# Patient Record
Sex: Female | Born: 1985 | Race: Black or African American | Hispanic: No | Marital: Single | Smoking: Never smoker
Health system: Southern US, Community
[De-identification: ages and names within clinical notes are randomized; demographics above are authoritative.]

## PROBLEM LIST (undated history)

## (undated) DIAGNOSIS — L509 Urticaria, unspecified: Secondary | ICD-10-CM

## (undated) DIAGNOSIS — D509 Iron deficiency anemia, unspecified: Secondary | ICD-10-CM

## (undated) DIAGNOSIS — J45909 Unspecified asthma, uncomplicated: Secondary | ICD-10-CM

## (undated) DIAGNOSIS — I10 Essential (primary) hypertension: Secondary | ICD-10-CM

## (undated) HISTORY — DX: Urticaria, unspecified: L50.9

---

## 2013-04-30 ENCOUNTER — Emergency Department (HOSPITAL_COMMUNITY)
Admission: EM | Admit: 2013-04-30 | Discharge: 2013-05-01 | Disposition: A | Discharge status: Home / Self Care | Payer: No Typology Code available for payment source | Attending: Emergency Medicine | Admitting: Emergency Medicine

## 2013-04-30 ENCOUNTER — Encounter (HOSPITAL_COMMUNITY): Payer: Self-pay | Admitting: Emergency Medicine

## 2013-04-30 DIAGNOSIS — S335XXA Sprain of ligaments of lumbar spine, initial encounter: Secondary | ICD-10-CM | POA: Insufficient documentation

## 2013-04-30 DIAGNOSIS — Z79899 Other long term (current) drug therapy: Secondary | ICD-10-CM | POA: Insufficient documentation

## 2013-04-30 DIAGNOSIS — S39012A Strain of muscle, fascia and tendon of lower back, initial encounter: Secondary | ICD-10-CM

## 2013-04-30 DIAGNOSIS — Y9389 Activity, other specified: Secondary | ICD-10-CM | POA: Insufficient documentation

## 2013-04-30 DIAGNOSIS — Z880 Allergy status to penicillin: Secondary | ICD-10-CM | POA: Insufficient documentation

## 2013-04-30 DIAGNOSIS — IMO0002 Reserved for concepts with insufficient information to code with codable children: Secondary | ICD-10-CM | POA: Insufficient documentation

## 2013-04-30 DIAGNOSIS — S139XXA Sprain of joints and ligaments of unspecified parts of neck, initial encounter: Secondary | ICD-10-CM | POA: Insufficient documentation

## 2013-04-30 DIAGNOSIS — Y92410 Unspecified street and highway as the place of occurrence of the external cause: Secondary | ICD-10-CM | POA: Insufficient documentation

## 2013-04-30 DIAGNOSIS — S161XXA Strain of muscle, fascia and tendon at neck level, initial encounter: Secondary | ICD-10-CM

## 2013-04-30 MED ORDER — HYDROCODONE-ACETAMINOPHEN 5-325 MG PO TABS: 1.0000 | ORAL_TABLET | ORAL | Status: DC | PRN

## 2013-04-30 MED ORDER — IBUPROFEN 800 MG PO TABS: 800.0000 mg | ORAL_TABLET | Freq: Three times a day (TID) | ORAL | Status: DC

## 2013-04-30 MED ORDER — METHOCARBAMOL 500 MG PO TABS
500.0000 mg | ORAL_TABLET | Freq: Once | ORAL | Status: AC
  Administered 2013-04-30: 500 mg via ORAL
  Filled 2013-04-30: qty 1

## 2013-04-30 MED ORDER — METHOCARBAMOL 500 MG PO TABS: 500.0000 mg | ORAL_TABLET | Freq: Two times a day (BID) | ORAL | Status: DC

## 2013-04-30 MED ORDER — IBUPROFEN 800 MG PO TABS
800.0000 mg | ORAL_TABLET | Freq: Once | ORAL | Status: AC
  Administered 2013-04-30: 800 mg via ORAL
  Filled 2013-04-30: qty 1
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
## 2013-05-01 NOTE — ED Provider Notes (Signed)
Medical screening examination/treatment/procedure(s) were performed by non-physician practitioner and as supervising physician I was immediately available for consultation/collaboration.   Bryli Mantey L [place] Zou, MD 05/01/13 1247 

## 2016-10-29 LAB — HM PAP SMEAR

## 2017-01-14 ENCOUNTER — Encounter: Payer: Self-pay | Admitting: Adult Health

## 2017-07-15 ENCOUNTER — Other Ambulatory Visit: Payer: Self-pay

## 2017-07-15 ENCOUNTER — Encounter (HOSPITAL_COMMUNITY): Payer: Self-pay

## 2017-07-15 ENCOUNTER — Emergency Department (HOSPITAL_COMMUNITY)
Admission: EM | Admit: 2017-07-15 | Discharge: 2017-07-15 | Disposition: A | Discharge status: Home / Self Care | Payer: BLUE CROSS/BLUE SHIELD | Attending: Emergency Medicine | Admitting: Emergency Medicine

## 2017-07-15 ENCOUNTER — Emergency Department (HOSPITAL_COMMUNITY): Payer: BLUE CROSS/BLUE SHIELD

## 2017-07-15 DIAGNOSIS — J45909 Unspecified asthma, uncomplicated: Secondary | ICD-10-CM | POA: Diagnosis not present

## 2017-07-15 DIAGNOSIS — I10 Essential (primary) hypertension: Secondary | ICD-10-CM | POA: Diagnosis not present

## 2017-07-15 DIAGNOSIS — R079 Chest pain, unspecified: Secondary | ICD-10-CM | POA: Diagnosis present

## 2017-07-15 DIAGNOSIS — Z79899 Other long term (current) drug therapy: Secondary | ICD-10-CM | POA: Diagnosis not present

## 2017-07-15 HISTORY — DX: Unspecified asthma, uncomplicated: J45.909

## 2017-07-15 HISTORY — DX: Essential (primary) hypertension: I10

## 2017-07-15 LAB — BASIC METABOLIC PANEL
Anion gap: 12 (ref 5–15)
BUN: 11 mg/dL (ref 6–20)
CO2: 28 mmol/L (ref 22–32)
Calcium: 9.2 mg/dL (ref 8.9–10.3)
Chloride: 104 mmol/L (ref 101–111)
Creatinine, Ser: 0.85 mg/dL (ref 0.44–1.00)
GFR calc Af Amer: >60 mL/min (ref >60)
GFR calc non Af Amer: >60 mL/min (ref >60)
Glucose, Bld: 97 mg/dL (ref 65–99)
Potassium: 3.2 mmol/L — ABNORMAL LOW (ref 3.5–5.1)
Sodium: 144 mmol/L (ref 135–145)

## 2017-07-15 LAB — CBC
HCT: 41.1 % (ref 36.0–46.0)
Hemoglobin: 13.1 g/dL (ref 12.0–15.0)
MCH: 24.5 pg — ABNORMAL LOW (ref 26.0–34.0)
MCHC: 31.9 g/dL (ref 30.0–36.0)
MCV: 76.8 fL — ABNORMAL LOW (ref 78.0–100.0)
Platelets: 463 10*3/uL — ABNORMAL HIGH (ref 150–400)
RBC: 5.35 MIL/uL — ABNORMAL HIGH (ref 3.87–5.11)
RDW: 15.3 % (ref 11.5–15.5)
WBC: 10.2 10*3/uL (ref 4.0–10.5)

## 2017-07-15 LAB — I-STAT BETA HCG BLOOD, ED (MC, WL, AP ONLY): I-stat hCG, quantitative: <5 m[IU]/mL (ref <5)

## 2017-07-15 LAB — D-DIMER, QUANTITATIVE: D-Dimer, Quant: 0.29 ug/mL-FEU (ref 0.00–0.50)

## 2017-07-15 LAB — I-STAT TROPONIN, ED: Troponin i, poc: 0.01 ng/mL (ref 0.00–0.08)

## 2017-07-15 MED ORDER — NAPROXEN 375 MG PO TABS: 375.0000 mg | ORAL_TABLET | Freq: Two times a day (BID) | ORAL | 0 refills | Status: DC

## 2017-07-15 NOTE — ED Provider Notes (Signed)
[facility] EMERGENCY DEPARTMENT Provider Note   CSN: [id number] Arrival date & time: 07/15/17  2202     History   Chief Complaint Chief Complaint  Patient presents with  . Chest Pain    HPI Diana Pacasamara Umland is a 32 y.o. female.  HPI Patient presents to the emergency room for evaluation of chest pain that started a few days ago.  Patient states she had some sharp pain between her shoulder blades.  She also has been feeling short of breath.  She has a history of asthma so she tried using her inhaler without relief.  Patient's had some intermittent palpitations as well as lightheadedness.  She denies any history of heart disease.  She does have a history of hypertension.  She denies any history of PE or DVT.  No recent trips or travel.  She does take birth control pills. Past Medical History:  Diagnosis Date  . Asthma   . Hypertension     There are no active problems to display for this patient.   History reviewed. No pertinent surgical history.   OB History   None      Home Medications    Prior to Admission medications   Medication Sig Start Date End Date Taking? Authorizing Provider  albuterol (PROVENTIL HFA;VENTOLIN HFA) 108 (90 BASE) MCG/ACT inhaler Inhale 2 puffs into the lungs every 6 (six) hours as needed for wheezing or shortness of breath.   Yes [provider]  amLODipine (NORVASC) 10 MG tablet Take 10 mg by mouth daily.   Yes [provider]  cetirizine (ZYRTEC) 10 MG tablet Take 10 mg by mouth daily.   Yes [provider]  LO LOESTRIN FE 1 MG-10 MCG / 10 MCG tablet Take 1 tablet by mouth daily. 07/08/17  Yes [provider]  metoprolol tartrate (LOPRESSOR) 25 MG tablet Take 25 mg by mouth 3 (three) times daily.   Yes [provider]  Multiple Vitamin (MULTIVITAMIN WITH MINERALS) TABS tablet Take 1 tablet by mouth daily.   Yes [provider]  naproxen (NAPROSYN) 375 MG tablet Take 1 tablet (375 mg total) by  mouth 2 (two) times daily. 07/15/17   Linwood DibblesKnapp, Kallon Caylor, MD    Family History History reviewed. No pertinent family history.  Social History Social History   Tobacco Use  . Smoking status: Never Smoker  . Smokeless tobacco: Never Used  Substance Use Topics  . Alcohol use: No  . Drug use: No     Allergies   Pseudoephedrine and Amoxicillin   Review of Systems Review of Systems  All other systems reviewed and are negative.    Physical Exam Updated Vital Signs BP (!) 157/110 (BP Location: Right Arm)   Pulse 89   Temp 98.4 F (36.9 C) (Oral)   Resp (!) 22   Ht 1.575 m (5\' 2" )   Wt 65.3 kg (144 lb)   LMP 07/09/2017 (Exact Date)   SpO2 100%   BMI 26.34 kg/m   Physical Exam  Constitutional: She appears well-developed and well-nourished. No distress.  HENT:  Head: Normocephalic and atraumatic.  Right Ear: External ear normal.  Left Ear: External ear normal.  Eyes: Conjunctivae are normal. Right eye exhibits no discharge. Left eye exhibits no discharge. No scleral icterus.  Neck: Neck supple. No tracheal deviation present.  Cardiovascular: Normal rate, regular rhythm and intact distal pulses.  Pulmonary/Chest: Effort normal and breath sounds normal. No stridor. No respiratory distress. She has no wheezes. She has no rales.  Abdominal: Soft. Bowel sounds are normal. She exhibits no distension. There is no tenderness. There is no rebound and no guarding.  Musculoskeletal: She exhibits no edema or tenderness.  Neurological: She is alert. She has normal strength. No cranial nerve deficit (no facial droop, extraocular movements intact, no slurred speech) or sensory deficit. She exhibits normal muscle tone. She displays no seizure activity. Coordination normal.  Skin: Skin is warm and dry. No rash noted.  Psychiatric: She has a normal mood and affect.  Nursing note and vitals reviewed.    ED Treatments / Results  Labs (all labs ordered are listed, but only abnormal results are  displayed) Labs Reviewed  BASIC METABOLIC PANEL - Abnormal; Notable for the following components:      Result Value   Potassium 3.2 (*)    All other components within normal limits  CBC - Abnormal; Notable for the following components:   RBC 5.35 (*)    MCV 76.8 (*)    MCH 24.5 (*)    Platelets 463 (*)    All other components within normal limits  D-DIMER, QUANTITATIVE (NOT AT [facility])  I-STAT TROPONIN, ED  I-STAT BETA HCG BLOOD, ED ([facility], [facility], [facility] ONLY)    EKG EKG Interpretation  Date/Time:  Wednesday July 15 2017 22:18:37 EDT Ventricular Rate:  83 PR Interval:    QRS Duration: 88 QT Interval:  362 QTC Calculation: 426 R Axis:   58 Text Interpretation:  Sinus rhythm Probable left atrial enlargement No old tracing to compare Confirmed by Linwood Dibbles [phone]) on 07/15/2017 10:27:24 PM   Radiology Dg Chest 2 View  Result Date: 07/15/2017 CLINICAL DATA:  Chest pain EXAM: CHEST - 2 VIEW COMPARISON:  None. FINDINGS: The cardiac silhouette is borderline to mildly enlarged. The hila and mediastinum are normal. No pneumothorax. No pulmonary nodules or masses. No other acute abnormalities. IMPRESSION: The cardiac silhouette is borderline to mildly enlarged. Electronically Signed   By: Gerome Sam III M.D   On: 07/15/2017 23:04    Procedures Procedures (including critical care time)  Medications Ordered in ED Medications - No data to display   Initial Impression / Assessment and Plan / ED Course  I have reviewed the triage vital signs and the nursing notes.  Pertinent labs & imaging results that were available during my care of the patient were reviewed by me and considered in my medical decision making (see chart for details).   Sx atypical for cardiac etiology.  EKG and labs are reassuring.  Doubt ACS.  Low risk for PE,  D dimer negative.  Doubt PE.  Dc home with nsaids.  Outpatient follow up if sx persist.  Final Clinical Impressions(s) / ED Diagnoses   Final diagnoses:    Chest pain, unspecified type    ED Discharge Orders        Ordered    naproxen (NAPROSYN) 375 MG tablet  2 times daily     07/15/17 2327       Linwood Dibbles, MD 07/15/17 2329

## 2017-07-15 NOTE — ED Triage Notes (Signed)
Having chest pain for the past couple of days, along with pain between my shoulder blades.  I have a history of asthma and have been using my inhalers without relief.  I have checked my blood pressure and it has been okay at home.

## 2017-07-15 NOTE — Discharge Instructions (Addendum)
Take the medications as needed for pain, follow up with your primary care doctor if not better in a week

## 2018-07-16 ENCOUNTER — Encounter: Payer: Self-pay | Admitting: Family Medicine

## 2018-07-16 ENCOUNTER — Ambulatory Visit (INDEPENDENT_AMBULATORY_CARE_PROVIDER_SITE_OTHER): Payer: 59 | Admitting: Family Medicine

## 2018-07-16 ENCOUNTER — Other Ambulatory Visit: Payer: Self-pay

## 2018-07-16 VITALS — BP 144/100 | HR 100 | Temp 98.2°F | Resp 14 | Ht 62.0 in | Wt 148.0 lb

## 2018-07-16 DIAGNOSIS — Z0001 Encounter for general adult medical examination with abnormal findings: Secondary | ICD-10-CM | POA: Diagnosis not present

## 2018-07-16 DIAGNOSIS — F5101 Primary insomnia: Secondary | ICD-10-CM | POA: Diagnosis not present

## 2018-07-16 DIAGNOSIS — Z114 Encounter for screening for human immunodeficiency virus [HIV]: Secondary | ICD-10-CM

## 2018-07-16 DIAGNOSIS — F4323 Adjustment disorder with mixed anxiety and depressed mood: Secondary | ICD-10-CM

## 2018-07-16 DIAGNOSIS — I10 Essential (primary) hypertension: Secondary | ICD-10-CM

## 2018-07-16 DIAGNOSIS — Z0000 Encounter for general adult medical examination without abnormal findings: Secondary | ICD-10-CM

## 2018-07-16 MED ORDER — AMLODIPINE BESYLATE 10 MG PO TABS: 10.0000 mg | ORAL_TABLET | Freq: Every day | ORAL | 3 refills | Status: DC

## 2018-07-16 MED ORDER — TRAZODONE HCL 50 MG PO TABS
25.0000 mg | ORAL_TABLET | Freq: Every evening | ORAL | 3 refills | Status: DC | PRN
Start: 2018-07-16 — End: 2020-06-22

## 2018-07-16 MED ORDER — PRENATAL VITAMINS 28-0.8 MG PO TABS: ORAL_TABLET | ORAL | Status: AC

## 2018-07-16 MED ORDER — ALBUTEROL SULFATE HFA 108 (90 BASE) MCG/ACT IN AERS: 2.0000 | INHALATION_SPRAY | RESPIRATORY_TRACT | 2 refills | Status: DC | PRN

## 2018-07-16 MED ORDER — HAIR VITAMINS PO TABS: ORAL_TABLET | ORAL | 0 refills | Status: DC

## 2018-07-16 MED ORDER — CETIRIZINE HCL 10 MG PO TABS: 10.0000 mg | ORAL_TABLET | Freq: Every day | ORAL | Status: DC

## 2018-07-16 MED ORDER — FLUTICASONE PROPIONATE 50 MCG/ACT NA SUSP: 2.0000 | Freq: Every day | NASAL | 6 refills | Status: DC

## 2018-07-16 NOTE — Progress Notes (Signed)
Subjective:    Patient ID: Diana [name], female    DOB: 09-08-1985, 33 y.o.   MRN: [id number]  Patient presents for New Patient (Initial Visit) (Elevated BP)  Patient here to establish care.  Previous PCP- Dr. Rondel OhVassireddy  [facility]  - PAP Smear UTD    No breast cancer in family    Menses regular   PMH/MEDS/FAMILY history reviewed  HTN- History of  HTN diagnosed age  33, also runs un family.  she changed jobs and lost insurance, has not been on meds  > 6 months, was on norvasc10mg  and metoprolol 25mg  daily   Elevated  170/101 when she last checked No known kidney disease      Contraceptive management - none current, no children, follows with GYN  Asthma- has albuterol prn, in the past was on advair. Last weeks had asthma attack and no meds.Takes Flonase, zyrtec   Works as Print production planneroffice manager for [employer]   Has been workin g on weight loss , was down to 138lbs   TDAP- Due  Influenza UTD   She has been under a lot of stress for the past month of so, difficulty at work, feels panicy at times other times just sad and depressed. Not sleeping well, takes melatonin every night Doesn't have anyone to talk to, states her family has there own lives and she is often the one they come to and she doesn't want to bother them Her new job has been particularly stressful recently Would like something to help her stress/sleep    Review Of Systems:  GEN- denies fatigue, fever, weight loss,weakness, recent illness HEENT- denies eye drainage, change in vision, nasal discharge, CVS- denies chest pain, palpitations RESP- denies SOB, cough, wheeze ABD- denies N/V, change in stools, abd pain GU- denies dysuria, hematuria, dribbling, incontinence MSK- denies joint pain, muscle aches, injury Neuro- denies headache, dizziness, syncope, seizure activity       Objective:    BP (!) 144/100   Pulse 100   Temp 98.2 F (36.8 C) (Oral)   Resp 14   Ht 5\' 2"  (1.575  m)   Wt 148 lb (67.1 kg)   SpO2 99%   BMI 27.07 kg/m  GEN- NAD, alert and oriented x3 HEENT- PERRL, EOMI, non injected sclera, pink conjunctiva, MMM, oropharynx clear Neck- Supple, no thyromegaly CVS- RRR, no murmur RESP-CTAB ABD-NABS,soft,NT,ND EXT- No edema Psych- pleasant, normal affect and mood, no apparent SI, well groomed  Pulses- Radial, DP- 2+        Assessment & Plan:      Problem List Items Addressed This Visit      Unprioritized   Essential hypertension    Restart norvasc 10mg  Check fasting labs Will add BB at next visit based on response  Continue with exercise routine, dietary changes       Relevant Medications   amLODipine (NORVASC) 10 MG tablet   Other Relevant Orders   TSH (Completed)   CBC with Differential/Platelet (Completed)   Comprehensive metabolic panel (Completed)   Lipid panel (Completed)   Insomnia    For stress and sleep, will try trazadone Start with  25mg  at bedtime and titrate to  50mg  if not improved Discussed therapy with her as well, she feels she has no one to talk to , let off steam      Situational mixed anxiety and depressive disorder   Relevant Medications   traZODone (DESYREL) 50 MG tablet    Other Visit Diagnoses  Routine general medical examination at a health care facility    -  Primary   CPE done obtain fasting labs, obtain GYn records, and previous PCP records , HIV screen   Relevant Orders   TSH (Completed)   CBC with Differential/Platelet (Completed)   Comprehensive metabolic panel (Completed)   Lipid panel (Completed)   Encounter for screening for HIV       Relevant Orders   HIV Antibody (routine testing w rflx)      Note: This dictation was prepared with Dragon dictation along with smaller phrase technology. Any transcriptional errors that result from this process are unintentional.

## 2018-07-16 NOTE — Patient Instructions (Addendum)
F/u 4 WEEKS  We will call with lab results  Take trazodone 1 hour before bedtime start with 1/2 tablet, if that does not work then take full tablet Start norvasc 10mg  once a day for blood pressure

## 2018-07-17 ENCOUNTER — Encounter: Payer: Self-pay | Admitting: Family Medicine

## 2018-07-17 DIAGNOSIS — I10 Essential (primary) hypertension: Secondary | ICD-10-CM | POA: Insufficient documentation

## 2018-07-17 DIAGNOSIS — G4700 Insomnia, unspecified: Secondary | ICD-10-CM | POA: Insufficient documentation

## 2018-07-17 DIAGNOSIS — F4323 Adjustment disorder with mixed anxiety and depressed mood: Secondary | ICD-10-CM | POA: Insufficient documentation

## 2018-07-17 NOTE — Assessment & Plan Note (Addendum)
Restart norvasc 10mg  Check fasting labs Will add BB at next visit based on response  Continue with exercise routine, dietary changes

## 2018-07-17 NOTE — Assessment & Plan Note (Signed)
For stress and sleep, will try trazadone Start with  25mg  at bedtime and titrate to  50mg  if not improved Discussed therapy with her as well, she feels she has no one to talk to , let off steam

## 2018-07-19 NOTE — Addendum Note (Signed)
Addended by: Vic Blackbird F on: 07/19/2018 03:28 PM   Modules accepted: Orders

## 2018-07-20 LAB — COMPREHENSIVE METABOLIC PANEL
AG Ratio: 1.6 (calc) (ref 1.0–2.5)
ALT: 13 U/L (ref 6–29)
AST: 14 U/L (ref 10–30)
Albumin: 4.3 g/dL (ref 3.6–5.1)
Alkaline phosphatase (APISO): 115 U/L (ref 31–125)
BUN: 15 mg/dL (ref 7–25)
CO2: 28 mmol/L (ref 20–32)
Calcium: 9.4 mg/dL (ref 8.6–10.2)
Chloride: 99 mmol/L (ref 98–110)
Creat: 0.65 mg/dL (ref 0.50–1.10)
Globulin: 2.7 g/dL (calc) (ref 1.9–3.7)
Glucose, Bld: 86 mg/dL (ref 65–99)
Potassium: 3.6 mmol/L (ref 3.5–5.3)
Sodium: 138 mmol/L (ref 135–146)
Total Bilirubin: 0.5 mg/dL (ref 0.2–1.2)
Total Protein: 7 g/dL (ref 6.1–8.1)

## 2018-07-20 LAB — LIPID PANEL
Cholesterol: 168 mg/dL (ref <200)
HDL: 66 mg/dL (ref >=50)
LDL Cholesterol (Calc): 89 mg/dL (calc)
Non-HDL Cholesterol (Calc): 102 mg/dL (calc) (ref <130)
Total CHOL/HDL Ratio: 2.5 (calc) (ref <5.0)
Triglycerides: 44 mg/dL (ref <150)

## 2018-07-20 LAB — CBC WITH DIFFERENTIAL/PLATELET
Absolute Monocytes: 690 cells/uL (ref 200–950)
Basophils Absolute: 64 cells/uL (ref 0–200)
Basophils Relative: 0.7 %
Eosinophils Absolute: 138 cells/uL (ref 15–500)
Eosinophils Relative: 1.5 %
HCT: 38.8 % (ref 35.0–45.0)
Hemoglobin: 12.7 g/dL (ref 11.7–15.5)
Lymphs Abs: 3376 cells/uL (ref 850–3900)
MCH: 25.1 pg — ABNORMAL LOW (ref 27.0–33.0)
MCHC: 32.7 g/dL (ref 32.0–36.0)
MCV: 76.7 fL — ABNORMAL LOW (ref 80.0–100.0)
MPV: 9.2 fL (ref 7.5–12.5)
Monocytes Relative: 7.5 %
Neutro Abs: 4931 cells/uL (ref 1500–7800)
Neutrophils Relative %: 53.6 %
Platelets: 464 10*3/uL — ABNORMAL HIGH (ref 140–400)
RBC: 5.06 10*6/uL (ref 3.80–5.10)
RDW: 14.2 % (ref 11.0–15.0)
Total Lymphocyte: 36.7 %
WBC: 9.2 10*3/uL (ref 3.8–10.8)

## 2018-07-20 LAB — TEST AUTHORIZATION

## 2018-07-20 LAB — TSH: TSH: 0.31 mIU/L — ABNORMAL LOW

## 2018-07-20 LAB — T4, FREE: Free T4: 1.3 ng/dL (ref 0.8–1.8)

## 2018-07-20 LAB — T3, FREE: T3, Free: 4.2 pg/mL (ref 2.3–4.2)

## 2018-07-20 LAB — HIV ANTIBODY (ROUTINE TESTING W REFLEX): HIV 1&2 Ab, 4th Generation: NONREACTIVE

## 2018-07-28 ENCOUNTER — Encounter: Payer: Self-pay | Admitting: Family Medicine

## 2018-08-03 ENCOUNTER — Encounter: Payer: Self-pay | Admitting: Family Medicine

## 2018-08-03 MED ORDER — METOPROLOL SUCCINATE ER 25 MG PO TB24: 25.0000 mg | ORAL_TABLET | Freq: Every day | ORAL | 3 refills | Status: DC

## 2018-08-09 ENCOUNTER — Encounter: Payer: Self-pay | Admitting: *Deleted

## 2018-08-16 ENCOUNTER — Encounter: Payer: Self-pay | Admitting: Family Medicine

## 2018-08-16 ENCOUNTER — Ambulatory Visit (INDEPENDENT_AMBULATORY_CARE_PROVIDER_SITE_OTHER): Payer: 59 | Admitting: Family Medicine

## 2018-08-16 ENCOUNTER — Other Ambulatory Visit: Payer: Self-pay

## 2018-08-16 VITALS — BP 136/84 | HR 90 | Temp 98.6°F | Resp 12 | Ht 62.0 in | Wt 153.0 lb

## 2018-08-16 DIAGNOSIS — I10 Essential (primary) hypertension: Secondary | ICD-10-CM

## 2018-08-16 DIAGNOSIS — J301 Allergic rhinitis due to pollen: Secondary | ICD-10-CM

## 2018-08-16 DIAGNOSIS — F4323 Adjustment disorder with mixed anxiety and depressed mood: Secondary | ICD-10-CM | POA: Diagnosis not present

## 2018-08-16 DIAGNOSIS — R7989 Other specified abnormal findings of blood chemistry: Secondary | ICD-10-CM

## 2018-08-16 DIAGNOSIS — F5101 Primary insomnia: Secondary | ICD-10-CM

## 2018-08-16 DIAGNOSIS — J309 Allergic rhinitis, unspecified: Secondary | ICD-10-CM | POA: Insufficient documentation

## 2018-08-16 NOTE — Assessment & Plan Note (Signed)
Increase Flonase to twice a day dosing to help with the rhinitis and postnasal drip.

## 2018-08-16 NOTE — Assessment & Plan Note (Signed)
She is much improved today.  She is tolerating the metoprolol no change to medication.  For her abnormal thyroid function study [place] recheck that today.

## 2018-08-16 NOTE — Patient Instructions (Signed)
F/u 4 MONTHS  

## 2018-08-16 NOTE — Assessment & Plan Note (Signed)
Her sleep has improved with the trazodone which helps her stress some.  She is going to follow-up with therapist as scheduled in a few weeks.

## 2018-08-16 NOTE — Progress Notes (Signed)
   Subjective:    Patient ID: Diana [name], female    DOB: 09/13/1985, 33 y.o.   MRN: [id number]  Patient presents for Follow-up (is not fasting) HTN- had itching with norvasc, so started on Toprol 25mg  once a day, home readings 130's/80's    Pt has appt in a few weeks, tradozone 25mg  at bedtime, which helps with sleep she does continue to have significant amount of stress which she has scheduled with therapist her appointment is in a few weeks.  Asthma- has only used twice since our last visisit, she does zyrtec and flonase at bedtime, still gets scratchy throat when outside   Had ENT f/u , no stones in the gland.    No history of thyroid disorder- but in the past was worked up due to hypertension her recent labs did show abnormal TSH with normal T3 and T4 she is due for repeat        Review Of Systems:  GEN- denies fatigue, fever, weight loss,weakness, recent illness HEENT- denies eye drainage, change in vision, nasal discharge, CVS- denies chest pain, palpitations RESP- denies SOB, cough, wheeze ABD- denies N/V, change in stools, abd pain GU- denies dysuria, hematuria, dribbling, incontinence MSK- denies joint pain, muscle aches, injury Neuro- denies headache, dizziness, syncope, seizure activity       Objective:    BP 136/84   Pulse 90   Temp 98.6 F (37 C) (Oral)   Resp 12   Ht 5\' 2"  (1.575 m)   Wt 153 lb (69.4 kg)   LMP 08/04/2018 Comment: regular  SpO2 99%   BMI 27.98 kg/m  GEN- NAD, alert and oriented x3 HEENT- PERRL, EOMI, non injected sclera, pink conjunctiva, MMM, oropharynx clear Neck- Supple, no thyromegaly, no LAD CVS- RRR, no murmur RESP-CTAB Psych- normal affect and  mood EXT- No edema Pulses- Radial 2+        Assessment & Plan:      Problem List Items Addressed This Visit      Unprioritized   Allergic rhinitis    Increase Flonase to twice a day dosing to help with the rhinitis and postnasal drip.      Essential hypertension -  Primary    She is much improved today.  She is tolerating the metoprolol no change to medication.  For her abnormal thyroid function study [place] recheck that today.      Insomnia   Situational mixed anxiety and depressive disorder    Her sleep has improved with the trazodone which helps her stress some.  She is going to follow-up with therapist as scheduled in a few weeks.       Other Visit Diagnoses    Abnormal TSH       Relevant Orders   TSH   T3, free   T4, free      Note: This dictation was prepared with Dragon dictation along with smaller phrase technology. Any transcriptional errors that result from this process are unintentional.

## 2018-08-17 LAB — T4, FREE: Free T4: 1.1 ng/dL (ref 0.8–1.8)

## 2018-08-17 LAB — TSH: TSH: 0.28 mIU/L — ABNORMAL LOW

## 2018-08-17 LAB — T3, FREE: T3, Free: 3.3 pg/mL (ref 2.3–4.2)

## 2018-08-17 LAB — EXTRA LAV TOP TUBE

## 2018-08-25 ENCOUNTER — Other Ambulatory Visit: Payer: Self-pay | Admitting: *Deleted

## 2018-08-25 DIAGNOSIS — E0590 Thyrotoxicosis, unspecified without thyrotoxic crisis or storm: Secondary | ICD-10-CM

## 2018-09-16 ENCOUNTER — Encounter: Payer: Self-pay | Admitting: Family Medicine

## 2018-09-16 NOTE — Telephone Encounter (Signed)
Call placed to patient to inquire.   Reports that she has x2 days of upper back pain in between her shoulder blades that worsens when she take a deep breath. Reports that pain is intermittent, but is noticeable with moving either arm up or down or reaching over her head.   States that she had similar pain in January and was evaluated in UC with no cause found. Muscle relaxer was given and is helpful. Also states that heating pad is helpful.   Advised to use muscle relaxer and heating pad as needed. Advised to rest as much as possible to give muscle time to heal. Advised if pain is not improved by Monday to schedule OV for evaluation.

## 2018-10-12 ENCOUNTER — Encounter: Payer: Self-pay | Admitting: Family Medicine

## 2018-10-14 ENCOUNTER — Ambulatory Visit: Payer: 59 | Admitting: "Endocrinology

## 2018-11-12 ENCOUNTER — Ambulatory Visit: Payer: 59 | Admitting: "Endocrinology

## 2018-11-30 ENCOUNTER — Encounter: Payer: Self-pay | Admitting: Family Medicine

## 2018-12-24 ENCOUNTER — Ambulatory Visit: Payer: 59 | Admitting: Family Medicine

## 2019-02-11 ENCOUNTER — Other Ambulatory Visit: Payer: Self-pay

## 2019-02-11 ENCOUNTER — Ambulatory Visit (INDEPENDENT_AMBULATORY_CARE_PROVIDER_SITE_OTHER): Payer: Self-pay | Admitting: Family Medicine

## 2019-02-11 ENCOUNTER — Encounter: Payer: Self-pay | Admitting: *Deleted

## 2019-02-11 ENCOUNTER — Encounter: Payer: Self-pay | Admitting: Family Medicine

## 2019-02-11 VITALS — BP 152/98 | HR 88 | Temp 98.1°F | Resp 14 | Ht 62.0 in | Wt 148.0 lb

## 2019-02-11 DIAGNOSIS — M546 Pain in thoracic spine: Secondary | ICD-10-CM

## 2019-02-11 DIAGNOSIS — E041 Nontoxic single thyroid nodule: Secondary | ICD-10-CM

## 2019-02-11 DIAGNOSIS — I10 Essential (primary) hypertension: Secondary | ICD-10-CM

## 2019-02-11 DIAGNOSIS — E0590 Thyrotoxicosis, unspecified without thyrotoxic crisis or storm: Secondary | ICD-10-CM | POA: Insufficient documentation

## 2019-02-11 DIAGNOSIS — G8929 Other chronic pain: Secondary | ICD-10-CM

## 2019-02-11 DIAGNOSIS — F5101 Primary insomnia: Secondary | ICD-10-CM

## 2019-02-11 MED ORDER — CYCLOBENZAPRINE HCL 5 MG PO TABS: 5.0000 mg | ORAL_TABLET | Freq: Three times a day (TID) | ORAL | 1 refills | Status: DC | PRN

## 2019-02-11 NOTE — Assessment & Plan Note (Signed)
Repeat blood pressure was elevated in the office but initially quite calm and it was down.  We will have her check her blood pressure at home regularly.  If her blood pressure is greater than 140/90 then she is to increase her metoprolol to 75 mg twice a day.  And obtain all the records from the hospital she did not know results of her blood work or most of the imaging.  Her EKG is reassuring today with regard to the symptoms into her left hand.  I did query may be a carpal tunnel as she is on the computer a lot however this recently started after her blood pressure went up.  My other concern is underlying subclinical hyper thyroidism it may be that she is actually converted to a true hyperthyroidism.  She is willing to establish with an endocrinologist as she also has this calcified nodule which was told it needs to be followed up.Marland Kitchen

## 2019-02-11 NOTE — Progress Notes (Signed)
Subjective:    Patient ID: Diana [name], female    DOB: January 04, 1986, 34 y.o.   MRN: [id number]  Patient presents for ER F/U (HTN- [place]) and L Sided Pain (hand, fingers going numb, pain in arm)  Patient here for ER follow-up.  She was seen at the [facility] after an episode of elevated blood pressure. She was at work She was also having numbness and tingling into her left hand. She was on metoprolol 25 mg once a day for blood pressure as well as trazodone as needed for sleep but she had not been taking the trazodone.  Allergy medications as needed Hx of blood pressure when she was at work having the symptoms it was elevated so she was sent to the local emergency room for evaluation.  When she was finally seen in the emergency room after about a 12-hour wait blood pressure was very elevated 210/90 , she had CT of head and chest  she told that she had a calcified nodule on her thyroid not sure if it was enlarged or not.   Told CT head of was normal She had EKG done/ CXR/ spine xray was not told anything   Also has known clinical hypothyroidism her last set of thyroid labs were done in July.  Her TSH at that time was 0.28 she was referred to endocrinology but she was never able to make the appointment.  Fortunately she has lost her insurance and she has changed jobs  She is now on metporlol 50mg  BID states that they change her medication after they gave her something for her IV to bring her blood pressure down. Last night she felt a little discomfort into her left arm, and pink finger , had tingling and numbness and arm felt a little cold on outer side  She did not have any of these hand/finger symptoms before her BP went up  She get gets intermittant back pain between the shoulder blades for hte past year Feels like she cant breathe Seen by chiropracter told it was a strain and they did manipulations She uses heating pad, TENS machine which  helps a little  She tries not to take a lot of medication for his    Review Of Systems:  GEN- denies fatigue, fever, weight loss,weakness, recent illness HEENT- denies eye drainage, change in vision, nasal discharge, CVS- denies chest pain, palpitations RESP- denies SOB, cough, wheeze ABD- denies N/V, change in stools, abd pain GU- denies dysuria, hematuria, dribbling, incontinence MSK- denies joint pain, +muscle aches, injury Neuro- denies headache, dizziness, syncope, seizure activity       Objective:    BP (!) 152/98   Pulse 88   Temp 98.1 F (36.7 C) (Temporal)   Resp 14   Ht 5\' 2"  (1.575 m)   Wt 148 lb (67.1 kg)   LMP 02/08/2019 Comment: regular  SpO2 94%   BMI 27.07 kg/m  GEN- NAD, alert and oriented x3 HEENT- PERRL, EOMI, non injected sclera, pink conjunctiva, MMM, oropharynx clear Neck- Supple, no thyromegaly CVS- RRR, no murmur RESP-CTAB ABD-NABS,soft,NT,ND MSK-spine nontender no spasm noted good range of motion, rotator cuff intact full range of motion upper extremity EXT- No edema Pulses- Radial, DP- 2+   KG normal sinus rhythm no ST changes     Assessment & Plan:      Problem List Items Addressed This Visit      Unprioritized   Essential hypertension - Primary    Repeat  blood pressure was elevated in the office but initially quite calm and it was down.  We will have her check her blood pressure at home regularly.  If her blood pressure is greater than 140/90 then she is to increase her metoprolol to 75 mg twice a day.  And obtain all the records from the hospital she did not know results of her blood work or most of the imaging.  Her EKG is reassuring today with regard to the symptoms into her left hand.  I did query may be a carpal tunnel as she is on the computer a lot however this recently started after her blood pressure went up.  My other concern is underlying subclinical hyper thyroidism it may be that she is actually converted to a true  hyperthyroidism.  She is willing to establish with an endocrinologist as she also has this calcified nodule which was told it needs to be followed up..      Relevant Orders   EKG 12-Lead (Completed)   Subclinical hyperthyroidism    Other Visit Diagnoses    Thyroid nodule       Chronic midline thoracic back pain       Musculoskeletal pain.  She has had imaging on the spine we will obtain this as well.  In the meantime have given her Flexeril to use for spasm,she can use NSAID   Relevant Medications   cyclobenzaprine (FLEXERIL) 5 MG tablet      Note: This dictation was prepared with Dragon dictation along with smaller phrase technology. Any transcriptional errors that result from this process are unintentional.

## 2019-02-11 NOTE — Patient Instructions (Addendum)
Increase metoprolol to  75mg  BID  If BP is > 140/90 at home  We will call once I review all the notes Flexeril for muscle spasms Referral to endocrinology F/U pending results

## 2019-02-11 NOTE — Assessment & Plan Note (Signed)
Restart  Trazodone, which helped before

## 2019-02-14 ENCOUNTER — Telehealth: Payer: Self-pay | Admitting: Family Medicine

## 2019-02-14 NOTE — Telephone Encounter (Signed)
Call patient.  Her chest x-ray did not show any pneumonia but changes consistent with small airway disease or asthma.  She not have any difficulty breathing nothing needs to be done she does have albuterol on hand.  CT scan of her head did not show any abnormalities.  CT scan of her chest did not show any blood clot in the lungs her heart was mildly enlarged this can be seen with hypertension.  Continue the medications like we discussed.  She also has a calcified right thyroid nodule which she has been referred to endocrinology for.  I reviewed the labs.  They did not draw thyroid function studies.  These will need to be done with the endocrinologist.

## 2019-02-14 NOTE — Telephone Encounter (Signed)
Call placed to patient. LMTRC.  

## 2019-02-15 NOTE — Telephone Encounter (Signed)
Call placed to patient. LMTRC.  

## 2019-02-17 NOTE — Telephone Encounter (Signed)
Pt notified Verbalizes understanding 

## 2019-03-25 ENCOUNTER — Encounter: Payer: Self-pay | Admitting: Family Medicine

## 2019-03-25 MED ORDER — METOPROLOL TARTRATE 50 MG PO TABS: 50.0000 mg | ORAL_TABLET | Freq: Two times a day (BID) | ORAL | 2 refills | Status: DC

## 2019-03-30 ENCOUNTER — Other Ambulatory Visit: Payer: Self-pay

## 2019-03-30 ENCOUNTER — Encounter: Payer: Self-pay | Admitting: Family Medicine

## 2019-03-30 ENCOUNTER — Encounter: Payer: Self-pay | Admitting: "Endocrinology

## 2019-03-30 ENCOUNTER — Ambulatory Visit (INDEPENDENT_AMBULATORY_CARE_PROVIDER_SITE_OTHER): Payer: Self-pay | Admitting: "Endocrinology

## 2019-03-30 VITALS — BP 140/82 | HR 75 | Ht 62.0 in | Wt 149.6 lb

## 2019-03-30 DIAGNOSIS — E0520 Thyrotoxicosis with toxic multinodular goiter without thyrotoxic crisis or storm: Secondary | ICD-10-CM

## 2019-03-30 MED ORDER — PREDNISONE 10 MG PO TABS: ORAL_TABLET | ORAL | 0 refills | Status: DC

## 2019-03-30 MED ORDER — CETIRIZINE HCL 10 MG PO TABS: 10.0000 mg | ORAL_TABLET | Freq: Every day | ORAL | 1 refills | Status: DC

## 2019-03-30 NOTE — Telephone Encounter (Signed)
Call placed to patient and patient made aware.   Prescription sent to pharmacy.  

## 2019-03-30 NOTE — Progress Notes (Signed)
Endocrinology Consult Note                                            03/30/2019, 3:33 PM   Subjective:    Patient ID: Diana [name], female    DOB: 1985/07/19, PCP Alycia Rossetti, MD   Past Medical History:  Diagnosis Date  . Asthma   . Hypertension    History reviewed. No pertinent surgical history. Social History   Socioeconomic History  . Marital status: Single    Spouse name: Not on file  . Number of children: Not on file  . Years of education: Not on file  . Highest education level: Not on file  Occupational History  . Not on file  Tobacco Use  . Smoking status: Never Smoker  . Smokeless tobacco: Never Used  Substance and Sexual Activity  . Alcohol use: No  . Drug use: No  . Sexual activity: Not on file  Other Topics Concern  . Not on file  Social History Narrative  . Not on file   Social Determinants of Health   Financial Resource Strain:   . Difficulty of Paying Living Expenses: Not on file  Food Insecurity:   . Worried About Charity fundraiser in the Last Year: Not on file  . Ran Out of Food in the Last Year: Not on file  Transportation Needs:   . Lack of Transportation (Medical): Not on file  . Lack of Transportation (Non-Medical): Not on file  Physical Activity:   . Days of Exercise per Week: Not on file  . Minutes of Exercise per Session: Not on file  Stress:   . Feeling of Stress : Not on file  Social Connections:   . Frequency of Communication with Friends and Family: Not on file  . Frequency of Social Gatherings with Friends and Family: Not on file  . Attends Religious Services: Not on file  . Active Member of Clubs or Organizations: Not on file  . Attends Archivist Meetings: Not on file  . Marital Status: Not on file   Family History  Problem Relation Age of Onset  . Hypertension Mother   . Hypertension Father    Outpatient Encounter Medications as of 03/30/2019  Medication Sig  . albuterol (VENTOLIN HFA) 108  (90 Base) MCG/ACT inhaler Inhale 2 puffs into the lungs every 4 (four) hours as needed for wheezing or shortness of breath.  . cetirizine (ZYRTEC) 10 MG tablet Take 1 tablet (10 mg total) by mouth daily.  . cyclobenzaprine (FLEXERIL) 5 MG tablet Take 1 tablet (5 mg total) by mouth 3 (three) times daily as needed for muscle spasms.  . fluticasone (FLONASE) 50 MCG/ACT nasal spray Place 2 sprays into both nostrils daily.  . metoprolol tartrate (LOPRESSOR) 50 MG tablet Take 1 tablet (50 mg total) by mouth 2 (two) times daily.  . Multiple Vitamins-Minerals (HAIR VITAMINS) TABS 1 tablet daily  . Prenatal Vit-Fe Fumarate-FA (PRENATAL VITAMINS) 28-0.8 MG TABS 1 tablet daily  . traZODone (DESYREL) 50 MG tablet Take 0.5-1 tablets (25-50 mg total) by mouth at bedtime as needed for sleep.   No facility-administered encounter medications on file as of 03/30/2019.   ALLERGIES: Allergies  Allergen Reactions  . Pseudoephedrine Palpitations    Raises BP also  . Norvasc [Amlodipine Besylate] Itching  . Amoxicillin Itching and  Rash    VACCINATION STATUS: Immunization History  Administered Date(s) Administered  . Hepatitis B 11/23/1995, 12/21/1995, 05/30/1996    HPI Diana [name] is 34 y.o. female who presents today with a medical history as above. she is being seen in consultation for subclinical hypothyroidism, nodular goiter requested by Salley Scarlet, MD.   She denies any prior history of thyroid dysfunction which required a prescription.  She was found to have suppressed TSH in June and July 2020.  She reports weight loss of 5 pounds since July 2020.  She denies palpitations, tremors, heat/cold intolerance.  She denies any family history of thyroid dysfunction.  She is not currently on thyroid hormone supplements nor antithyroid treatment. Chest CT angio done for complaint of chest tightness in January 2021 incidentally showed calcified right thyroid nodule measuring 1.4 x 1.8 cm.  She denies  dysphagia, shortness of breath, no voice change.  Review of Systems  Constitutional: no recent weight gain/loss, no fatigue, no subjective hyperthermia, no subjective hypothermia Eyes: no blurry vision, no xerophthalmia ENT: no sore throat, no nodules palpated in throat, no dysphagia/odynophagia, no hoarseness Cardiovascular: no Chest Pain, no Shortness of Breath, no palpitations, no leg swelling Respiratory: no cough, no shortness of breath Gastrointestinal: no Nausea/Vomiting/Diarhhea Musculoskeletal: no muscle/joint aches Skin: no rashes Neurological: no tremors, no numbness, no tingling, no dizziness Psychiatric: no depression, no anxiety  Objective:    Vitals with BMI 03/30/2019 02/11/2019 02/11/2019  Height 5\' 2"  - 5\' 2"   Weight 149 lbs 10 oz - 148 lbs  BMI 27.36 - 27.06  Systolic 140 152  Diastolic 82 98 64  Pulse 75 - 88    BP 140/82   Pulse 75   Ht 5\' 2"  (1.575 m)   Wt 149 lb 9.6 oz (67.9 kg)   BMI 27.36 kg/m   Wt Readings from Last 3 Encounters:  03/30/19 149 lb 9.6 oz (67.9 kg)  02/11/19 148 lb (67.1 kg)  08/16/18 153 lb (69.4 kg)    Physical Exam  Constitutional:  Body mass index is 27.36 kg/m.,  not in acute distress, normal state of mind Eyes: PERRLA, EOMI, no exophthalmos ENT: moist mucous membranes, + palpable thyromegaly, no gross cervical lymphadenopathy Cardiovascular: normal precordial activity, Regular Rate and Rhythm, no Murmur/Rubs/Gallops Respiratory:  adequate breathing efforts, no gross chest deformity, Clear to auscultation bilaterally Gastrointestinal: abdomen soft, Non -tender, No distension, Bowel Sounds present, no gross organomegaly Musculoskeletal: no gross deformities, strength intact in all four extremities Skin: moist, warm, no rashes Neurological: no tremor with outstretched hands, Deep tendon reflexes normal in bilateral lower extremities.  CMP ( most recent) CMP     Component Value Date/Time   NA 138 07/16/2018 1255   K 3.6  07/16/2018 1255   CL 99 07/16/2018 1255   CO2 28 07/16/2018 1255   GLUCOSE 86 07/16/2018 1255   BUN 15 07/16/2018 1255   CREATININE 0.65 07/16/2018 1255   CALCIUM 9.4 07/16/2018 1255   PROT 7.0 07/16/2018 1255   AST 14 07/16/2018 1255   ALT 13 07/16/2018 1255   BILITOT 0.5 07/16/2018 1255   GFRNONAA >60 07/15/2017 2229   GFRAA >60 07/15/2017 2229    Lipid Panel ( most recent) Lipid Panel     Component Value Date/Time   CHOL 168 07/16/2018 1255   TRIG 44 07/16/2018 1255   HDL 66 07/16/2018 1255   CHOLHDL 2.5 07/16/2018 1255   LDLCALC 89 07/16/2018 1255      Lab Results  Component Value  Date   TSH 0.28 (L) 08/16/2018   TSH 0.31 (L) 07/16/2018   FREET4 1.1 08/16/2018   FREET4 1.3 07/16/2018     Assessment & Plan:   1. 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 - I have reviewed her available thyroid records and clinically evaluated the patient. - Based on these reviews, she has subclinical hyperthyroidism, incidental finding of thyroid nodule,  however,  there is not sufficient information to proceed with definitive treatment plan.  - she will need a repeat,  more complete thyroid function tests towards confirming the diagnosis.  She will be sent to lab for TSH, free T4/free T3, and antithyroid antibodies. -she will also need thyroid uptake and scan to characterize her thyroid function, and her thyroid nodule.    If her thyroid uptake and scan shows concerning cold nodules, she will be considered for thyroid ultrasound and subsequent possible fine-needle aspiration.    If her work-up confirms  primary hyperthyroidism, she will be considered for I-131 thyroid ablation.  - I did not initiate any new prescriptions today. - she is advised to maintain close follow up with Salley Scarlet, MD for primary care needs.   - Time spent with the patient: 45 minutes, of which >50% was spent in  counseling her  about her toxic nodule goiter and the rest in obtaining information about her symptoms, reviewing her previous labs/studies ( including abstractions from other facilities),  evaluations, and treatments,  and developing a plan to confirm diagnosis and long term treatment based on the latest standards of care/guidelines; and documenting her care.  Sharlyne Pacas participated in the discussions, expressed understanding, and voiced agreement with the above plans.  All questions were answered to her satisfaction. she is encouraged to contact clinic should she have any questions or concerns prior to her return visit.  Follow up plan: Return in about 10 days (around 04/09/2019) for Labs Today- Non-Fasting Ok, Follow up with Thyroid Uptake and Scan.   Marquis Lunch, MD [facility] [address] [place], [place] [address] Phone: [phone]  Fax: [phone]     03/30/2019, 3:33 PM  This note was partially dictated with voice recognition software. Similar sounding words can be transcribed inadequately or may not  be corrected upon review.

## 2019-04-04 ENCOUNTER — Encounter: Payer: Self-pay | Admitting: "Endocrinology

## 2019-04-15 ENCOUNTER — Ambulatory Visit: Payer: Self-pay | Admitting: "Endocrinology

## 2019-05-26 ENCOUNTER — Telehealth: Payer: Self-pay | Admitting: "Endocrinology

## 2019-05-26 NOTE — Telephone Encounter (Signed)
Denise with [facility] called and said that she needs you to call her at  [phone]

## 2019-05-27 NOTE — Telephone Encounter (Signed)
Tried to call listed number but it was a fax number.

## 2019-07-17 ENCOUNTER — Other Ambulatory Visit: Payer: Self-pay | Admitting: Family Medicine

## 2019-09-13 ENCOUNTER — Other Ambulatory Visit: Payer: Self-pay | Admitting: Family Medicine

## 2019-10-08 ENCOUNTER — Other Ambulatory Visit: Payer: Self-pay | Admitting: Family Medicine

## 2019-11-04 ENCOUNTER — Ambulatory Visit (INDEPENDENT_AMBULATORY_CARE_PROVIDER_SITE_OTHER): Payer: BC Managed Care – PPO | Admitting: Family Medicine

## 2019-11-04 ENCOUNTER — Other Ambulatory Visit: Payer: Self-pay

## 2019-11-04 ENCOUNTER — Encounter: Payer: Self-pay | Admitting: Family Medicine

## 2019-11-04 VITALS — BP 134/70 | HR 82 | Temp 98.1°F | Resp 14 | Ht 62.0 in | Wt 154.0 lb

## 2019-11-04 DIAGNOSIS — F4323 Adjustment disorder with mixed anxiety and depressed mood: Secondary | ICD-10-CM | POA: Diagnosis not present

## 2019-11-04 DIAGNOSIS — I10 Essential (primary) hypertension: Secondary | ICD-10-CM

## 2019-11-04 DIAGNOSIS — E0590 Thyrotoxicosis, unspecified without thyrotoxic crisis or storm: Secondary | ICD-10-CM

## 2019-11-04 DIAGNOSIS — F5101 Primary insomnia: Secondary | ICD-10-CM

## 2019-11-04 DIAGNOSIS — J4540 Moderate persistent asthma, uncomplicated: Secondary | ICD-10-CM

## 2019-11-04 DIAGNOSIS — Z23 Encounter for immunization: Secondary | ICD-10-CM | POA: Diagnosis not present

## 2019-11-04 DIAGNOSIS — E042 Nontoxic multinodular goiter: Secondary | ICD-10-CM

## 2019-11-04 DIAGNOSIS — J45909 Unspecified asthma, uncomplicated: Secondary | ICD-10-CM | POA: Insufficient documentation

## 2019-11-04 MED ORDER — MONTELUKAST SODIUM 10 MG PO TABS: 10.0000 mg | ORAL_TABLET | Freq: Every day | ORAL | 6 refills | Status: DC

## 2019-11-04 NOTE — Progress Notes (Signed)
   Subjective:    Patient ID: Diana [name], female    DOB: 18-Jun-1985, 34 y.o.   MRN: [id number]  Patient presents for Follow-up (is not fasting)  Pt here to f/u medications Last visit in Jan  She has been breaking out in hives for months, typically happens in the afternoons,  Her asthma has been up a lot  She has been using albuterol a lot  She is scheduled Labuer allergy and asthma  She is taking her zyrtec daily   She is due for recheck on thyroid - she needs new endrocrinologist   HTN- sometimes she does feel like BP is high, often feels heart racing which sh ehas with the thyroid diosorder During menses trual has fluid rention   Chronic insonia- taking trazodone  Review Of Systems:  GEN- denies fatigue, fever, weight loss,weakness, recent illness HEENT- denies eye drainage, change in vision, nasal discharge, CVS- denies chest pain, palpitations RESP- denies SOB, cough, wheeze ABD- denies N/V, change in stools, abd pain GU- denies dysuria, hematuria, dribbling, incontinence MSK- denies joint pain, muscle aches, injury Neuro- denies headache, dizziness, syncope, seizure activity       Objective:    BP 134/70   Pulse 82   Temp 98.1 F (36.7 C) (Temporal)   Resp 14   Ht 5\' 2"  (1.575 m)   Wt 154 lb (69.9 kg)   SpO2 100%   BMI 28.17 kg/m  GEN- NAD, alert and oriented x3 HEENT- PERRL, EOMI, non injected sclera, pink conjunctiva, MMM, oropharynx clear Neck- Supple, + thyromegaly CVS- RRR, no murmur RESP-CTAB ABD-NABS,soft,NT,ND Psych normal affect and mood  EXT- No edema Pulses- Radial, DP- 2+        Assessment & Plan:      Problem List Items Addressed This Visit      Unprioritized   Asthma    Pt requested to restart singulair, this worked in the past Will start again Keep f/u allergy/asthma in 2 weeks      Relevant Medications   montelukast (SINGULAIR) 10 MG tablet   Essential hypertension - Primary    Blood pressure controlled Query thyroid  disorder causing the palpitations Recheck TFT  Continue beta blocker      Relevant Orders   CBC with Differential/Platelet   Comprehensive metabolic panel   Insomnia   Situational mixed anxiety and depressive disorder    Continue trazodone      Subclinical hyperthyroidism   Relevant Orders   T3, free   TSH   T4, free   Ambulatory referral to Endocrinology    Other Visit Diagnoses    Multinodular goiter       Relevant Orders   Ambulatory referral to Endocrinology      Note: This dictation was prepared with Dragon dictation along with smaller phrase technology. Any transcriptional errors that result from this process are unintentional.

## 2019-11-04 NOTE — Assessment & Plan Note (Signed)
Pt requested to restart singulair, this worked in the past Will start again Keep f/u allergy/asthma in 2 weeks

## 2019-11-04 NOTE — Assessment & Plan Note (Signed)
Continue trazodone 

## 2019-11-04 NOTE — Patient Instructions (Addendum)
Start sinuglar Referral to new endocrinologist Continue all other meds Flu shot given  F/U 4 months

## 2019-11-04 NOTE — Assessment & Plan Note (Signed)
Blood pressure controlled Query thyroid disorder causing the palpitations Recheck TFT  Continue beta blocker

## 2019-11-05 LAB — CBC WITH DIFFERENTIAL/PLATELET
Absolute Monocytes: 664 cells/uL (ref 200–950)
Basophils Absolute: 63 cells/uL (ref 0–200)
Basophils Relative: 0.8 %
Eosinophils Absolute: 237 cells/uL (ref 15–500)
Eosinophils Relative: 3 %
HCT: 37.9 % (ref 35.0–45.0)
Hemoglobin: 11.9 g/dL (ref 11.7–15.5)
Lymphs Abs: 3610 cells/uL (ref 850–3900)
MCH: 24.4 pg — ABNORMAL LOW (ref 27.0–33.0)
MCHC: 31.4 g/dL — ABNORMAL LOW (ref 32.0–36.0)
MCV: 77.8 fL — ABNORMAL LOW (ref 80.0–100.0)
MPV: 9.6 fL (ref 7.5–12.5)
Monocytes Relative: 8.4 %
Neutro Abs: 3326 cells/uL (ref 1500–7800)
Neutrophils Relative %: 42.1 %
Platelets: 430 10*3/uL — ABNORMAL HIGH (ref 140–400)
RBC: 4.87 10*6/uL (ref 3.80–5.10)
RDW: 14.1 % (ref 11.0–15.0)
Total Lymphocyte: 45.7 %
WBC: 7.9 10*3/uL (ref 3.8–10.8)

## 2019-11-05 LAB — COMPREHENSIVE METABOLIC PANEL
AG Ratio: 1.6 (calc) (ref 1.0–2.5)
ALT: 8 U/L (ref 6–29)
AST: 9 U/L — ABNORMAL LOW (ref 10–30)
Albumin: 4.2 g/dL (ref 3.6–5.1)
Alkaline phosphatase (APISO): 104 U/L (ref 31–125)
BUN: 8 mg/dL (ref 7–25)
CO2: 28 mmol/L (ref 20–32)
Calcium: 8.8 mg/dL (ref 8.6–10.2)
Chloride: 106 mmol/L (ref 98–110)
Creat: 0.71 mg/dL (ref 0.50–1.10)
Globulin: 2.7 g/dL (calc) (ref 1.9–3.7)
Glucose, Bld: 81 mg/dL (ref 65–99)
Potassium: 3.6 mmol/L (ref 3.5–5.3)
Sodium: 140 mmol/L (ref 135–146)
Total Bilirubin: 0.3 mg/dL (ref 0.2–1.2)
Total Protein: 6.9 g/dL (ref 6.1–8.1)

## 2019-11-05 LAB — T3, FREE: T3, Free: 3.9 pg/mL (ref 2.3–4.2)

## 2019-11-05 LAB — TSH: TSH: 1.12 mIU/L

## 2019-11-05 LAB — T4, FREE: Free T4: 1.2 ng/dL (ref 0.8–1.8)

## 2019-11-10 ENCOUNTER — Encounter: Payer: Self-pay | Admitting: *Deleted

## 2019-11-21 DIAGNOSIS — J3081 Allergic rhinitis due to animal (cat) (dog) hair and dander: Secondary | ICD-10-CM | POA: Diagnosis not present

## 2019-11-21 DIAGNOSIS — Z01419 Encounter for gynecological examination (general) (routine) without abnormal findings: Secondary | ICD-10-CM | POA: Diagnosis not present

## 2019-11-21 DIAGNOSIS — L501 Idiopathic urticaria: Secondary | ICD-10-CM | POA: Diagnosis not present

## 2019-11-21 DIAGNOSIS — J301 Allergic rhinitis due to pollen: Secondary | ICD-10-CM | POA: Diagnosis not present

## 2019-11-21 DIAGNOSIS — J3089 Other allergic rhinitis: Secondary | ICD-10-CM | POA: Diagnosis not present

## 2019-12-12 ENCOUNTER — Encounter: Payer: Self-pay | Admitting: Family Medicine

## 2019-12-13 ENCOUNTER — Ambulatory Visit: Payer: BLUE CROSS/BLUE SHIELD | Admitting: Internal Medicine

## 2020-01-10 ENCOUNTER — Encounter: Payer: Self-pay | Admitting: Internal Medicine

## 2020-01-10 ENCOUNTER — Ambulatory Visit (INDEPENDENT_AMBULATORY_CARE_PROVIDER_SITE_OTHER): Payer: BLUE CROSS/BLUE SHIELD | Admitting: Internal Medicine

## 2020-01-10 ENCOUNTER — Other Ambulatory Visit: Payer: Self-pay

## 2020-01-10 VITALS — BP 140/74 | HR 106 | Ht 62.0 in | Wt 156.5 lb

## 2020-01-10 DIAGNOSIS — E041 Nontoxic single thyroid nodule: Secondary | ICD-10-CM | POA: Diagnosis not present

## 2020-01-10 DIAGNOSIS — E0590 Thyrotoxicosis, unspecified without thyrotoxic crisis or storm: Secondary | ICD-10-CM

## 2020-01-10 NOTE — Progress Notes (Signed)
Name: Diana [name]  MRN/ DOB: [id number], 1985-09-14    Age/ Sex: 34 y.o., female    PCP: Salley Scarlet, MD   Reason for Endocrinology Evaluation: Subclinical hyperthyroidism     Date of Initial Endocrinology Evaluation: 01/10/2020     HPI: [name] is a 34 y.o. female with a past medical history of Asthma . The patient presented for initial endocrinology clinic visit on 01/10/2020 for consultative assistance with her Subclinical Hyperthyroidism .     Pt has been noted to have a low TSH in 07/2018 with a TSH nadir of 0.28 uIU/mL in 08/2018. She was experiencing palpitations at the time as well as heat intolerance.    Repeat labs by 11/2019 were normal    She continues with palpitations, has occasional diarrhea. She denies weight loss   Of note, the pt noted to have an incidental finding of right thyroid partially calcified nodule 1.4x1.8 cm 02/2019  At the age of 30 , she was seen by ENT for a thyroid swelling. She had " blood drawn " of the thyroid nodule, no follow up on this since.   Has  Occasional right neck pain and swelling   Denies radiation exposure  She was biotin but this was stopped ~6 months ago     Denies FH of thyroid disease    HISTORY:  Past Medical History:  Past Medical History:  Diagnosis Date  . Asthma   . Hypertension      Past Surgical History: No past surgical history on file.   Social History:  reports that she has never smoked. She has never used smokeless tobacco. She reports that she does not drink alcohol and does not use drugs.  Family History: family history includes Hypertension in her father and mother.   HOME MEDICATIONS: Allergies as of 01/10/2020      Reactions   Pseudoephedrine Palpitations   Raises BP also   Norvasc [amlodipine Besylate] Itching   Amoxicillin Itching, Rash      Medication List       Accurate as of January 10, 2020 11:29 AM. If you have any questions, ask your nurse or doctor.         albuterol 108 (90 Base) MCG/ACT inhaler Commonly known as: VENTOLIN HFA INHALE 2 PUFFS INTO THE LUNGS EVERY 4 HOURS AS NEEDED FOR WHEEZE OR FOR SHORTNESS OF BREATH   cetirizine 10 MG tablet Commonly known as: ZYRTEC Take 1 tablet (10 mg total) by mouth daily.   cyclobenzaprine 5 MG tablet Commonly known as: FLEXERIL Take 1 tablet (5 mg total) by mouth 3 (three) times daily as needed for muscle spasms.   fluticasone 50 MCG/ACT nasal spray Commonly known as: FLONASE Place 2 sprays into both nostrils daily.   Hair Vitamins Tabs 1 tablet daily   metoprolol tartrate 50 MG tablet Commonly known as: LOPRESSOR TAKE 1 TABLET BY MOUTH TWICE A DAY   montelukast 10 MG tablet Commonly known as: SINGULAIR Take 1 tablet (10 mg total) by mouth at bedtime.   Prenatal Vitamins 28-0.8 MG Tabs 1 tablet daily   traZODone 50 MG tablet Commonly known as: DESYREL Take 0.5-1 tablets (25-50 mg total) by mouth at bedtime as needed for sleep.         REVIEW OF SYSTEMS: A comprehensive ROS was conducted with the patient and is negative except as per HPI and below:  ROS     OBJECTIVE:  VS: There were no vitals taken for this visit.  Wt Readings from Last 3 Encounters:  11/04/19 154 lb (69.9 kg)  03/30/19 149 lb 9.6 oz (67.9 kg)  02/11/19 148 lb (67.1 kg)     EXAM: General: Pt appears well and is in NAD  Neck: General: Supple without adenopathy. Thyroid: Thyroid size normal.  No goiter or nodules appreciated.   Lungs: Clear with good BS bilat with no rales, rhonchi, or wheezes  Heart: Auscultation: RRR.  Abdomen: Normoactive bowel sounds, soft, nontender, without masses or organomegaly palpable  Extremities:  BL LE: No pretibial edema normal ROM and strength.  Neuro: Cranial nerves: II - XII grossly intact  Motor: Normal strength throughout DTRs: 2+ and symmetric in UE without delay in relaxation phase  Mental Status: Judgment, insight: Intact Orientation: Oriented to time,  place, and person Mood and affect: No depression, anxiety, or agitation     DATA REVIEWED: Results for TALIYA, MCCLARD (MRN [id number]) as of 01/11/2020 13:34  Ref. Range 01/10/2020 15:26  TSH Latest Ref Range: 0.35 - 4.50 uIU/mL 0.78  T4,Free(Direct) Latest Ref Range: 0.60 - 1.60 ng/dL 4.23      ASSESSMENT/PLAN/RECOMMENDATIONS:   1. Right Thyroid Nodule :   - This was an incidental finding on CT scan , will proceed with thyroid ultrasound  - No local neck symptoms      2. Subclinical Hyperthyroidism :   - Pt with non-specfic symptoms  - Unclear the cause but she does recall being on Biotin last year and I wonder if this has anything to do with her abnormal results. She has been off Biotin for 6 months and repeat labs in 11/2019 were normal - Repeat TFT's are normal     F/U in 1 yr Signed electronically by: Lyndle Herrlich, MD  [facility]  [facility] [address] [place], [place] [address] Phone: [phone] FAX: [phone]   CC: Salley Scarlet, MD [address] [place] [address] Phone: [phone] Fax: [phone]   Return to Endocrinology clinic as below: Future Appointments  Date Time Provider Department Center  01/10/2020  3:00 PM Miguel Medal, Konrad Dolores, MD [id number]

## 2020-01-11 LAB — TSH: TSH: 0.78 u[IU]/mL (ref 0.35–4.50)

## 2020-01-11 LAB — T4, FREE: Free T4: 0.66 ng/dL (ref 0.60–1.60)

## 2020-02-20 ENCOUNTER — Ambulatory Visit (HOSPITAL_BASED_OUTPATIENT_CLINIC_OR_DEPARTMENT_OTHER): Payer: BC Managed Care – PPO

## 2020-03-08 ENCOUNTER — Other Ambulatory Visit: Payer: Self-pay | Admitting: Family Medicine

## 2020-04-04 DIAGNOSIS — Z30011 Encounter for initial prescription of contraceptive pills: Secondary | ICD-10-CM | POA: Diagnosis not present

## 2020-06-22 ENCOUNTER — Encounter: Payer: Self-pay | Admitting: Nurse Practitioner

## 2020-06-22 ENCOUNTER — Ambulatory Visit: Payer: BC Managed Care – PPO | Admitting: Nurse Practitioner

## 2020-06-22 ENCOUNTER — Other Ambulatory Visit: Payer: Self-pay

## 2020-06-22 VITALS — BP 134/100 | HR 83 | Temp 97.9°F | Ht 62.0 in | Wt 157.0 lb

## 2020-06-22 DIAGNOSIS — E559 Vitamin D deficiency, unspecified: Secondary | ICD-10-CM | POA: Diagnosis not present

## 2020-06-22 DIAGNOSIS — F5101 Primary insomnia: Secondary | ICD-10-CM

## 2020-06-22 DIAGNOSIS — I10 Essential (primary) hypertension: Secondary | ICD-10-CM

## 2020-06-22 DIAGNOSIS — Z833 Family history of diabetes mellitus: Secondary | ICD-10-CM

## 2020-06-22 DIAGNOSIS — Z8639 Personal history of other endocrine, nutritional and metabolic disease: Secondary | ICD-10-CM | POA: Diagnosis not present

## 2020-06-22 MED ORDER — METOPROLOL TARTRATE 50 MG PO TABS: 50.0000 mg | ORAL_TABLET | Freq: Two times a day (BID) | ORAL | 2 refills | Status: DC

## 2020-06-22 MED ORDER — TRAZODONE HCL 50 MG PO TABS: 25.0000 mg | ORAL_TABLET | Freq: Every evening | ORAL | 3 refills | Status: DC | PRN

## 2020-06-22 MED ORDER — LISINOPRIL-HYDROCHLOROTHIAZIDE 10-12.5 MG PO TABS: 1.0000 | ORAL_TABLET | Freq: Every day | ORAL | 1 refills | Status: DC

## 2020-06-22 NOTE — Patient Instructions (Signed)

## 2020-06-22 NOTE — Progress Notes (Addendum)
Subjective:    Patient ID: Diana [name], female    DOB: Dec 25, 1985, 35 y.o.   MRN: [id number]  HPI: Diana [name] is a 35 y.o. female presenting for elevated blood pressure.  Chief Complaint  Patient presents with  . Hypertension    Does not feel bp med is working. Readings have been in the 150's right arm. Still taking meds bid after breakfast and dinner. Having headaches, taking otc for that   HYPERTENSION Currently taking Metoprolol 50 mg twice daily.  Took amlodipine in the past; got a rash. Hypertension status: uncontrolled  Satisfied with current treatment? no Duration of hypertension: chronic BP monitoring frequency:  Daily BP range: 150/100s; 130s/80s BP medication side effects:  no Medication compliance: excellent Aspirin: no Recurrent headaches: yes Visual changes: no Palpitations: yes; went to endocrinologist and thyroid is being monitored. Dyspnea: no; some shortness of breath with increased physical activity.  Went to allergist - has lots of allergens and is following allergen regimen Chest pain: no Lower extremity edema: no Dizzy/lightheaded: yes  Has been trying to increase physical activity: Cardio - jumps rope, jumping jacks, jogs for 30 minutes 3 days per week; has been doing for 1 month    Eats pretty healthy - lots of vegetables, no fried foods (upset stomach), salads when eating out; drinks >70 oz water daily -   LMP : does not get periods monthly due to OCP  Has been taking OCP faithfully.  Not worried about pregnancy.  Allergies  Allergen Reactions  . Pseudoephedrine Palpitations    Raises BP also  . Norvasc [Amlodipine Besylate] Itching  . Amoxicillin Itching and Rash    Outpatient Encounter Medications as of 06/22/2020  Medication Sig  . albuterol (VENTOLIN HFA) 108 (90 Base) MCG/ACT inhaler INHALE 2 PUFFS INTO THE LUNGS EVERY 4 HOURS AS NEEDED FOR WHEEZE OR FOR SHORTNESS OF BREATH  . BREO ELLIPTA 100-25 MCG/INH AEPB Inhale 1 puff into the  lungs daily.  . cetirizine (ZYRTEC) 10 MG tablet Take 1 tablet (10 mg total) by mouth daily.  . cyclobenzaprine (FLEXERIL) 5 MG tablet Take 1 tablet (5 mg total) by mouth 3 (three) times daily as needed for muscle spasms.  . fluticasone (FLONASE) 50 MCG/ACT nasal spray Place 2 sprays into both nostrils daily.  . INCASSIA 0.35 MG tablet Take 1 tablet by mouth daily.  . montelukast (SINGULAIR) 10 MG tablet Take 1 tablet (10 mg total) by mouth at bedtime.  . Multiple Vitamins-Minerals (HAIR VITAMINS) TABS 1 tablet daily  . Prenatal Vit-Fe Fumarate-FA (PRENATAL VITAMINS) 28-0.8 MG TABS 1 tablet daily  . [DISCONTINUED] metoprolol tartrate (LOPRESSOR) 50 MG tablet TAKE 1 TABLET BY MOUTH TWICE A DAY  . [DISCONTINUED] traZODone (DESYREL) 50 MG tablet Take 0.5-1 tablets (25-50 mg total) by mouth at bedtime as needed for sleep.  Marland Kitchen lisinopril-hydrochlorothiazide (ZESTORETIC) 10-12.5 MG tablet Take 1 tablet by mouth daily.  . metoprolol tartrate (LOPRESSOR) 50 MG tablet Take 1 tablet (50 mg total) by mouth 2 (two) times daily.  . traZODone (DESYREL) 50 MG tablet Take 0.5-1 tablets (25-50 mg total) by mouth at bedtime as needed for sleep.   No facility-administered encounter medications on file as of 06/22/2020.    Patient Active Problem List   Diagnosis Date Noted  . Right thyroid nodule 01/10/2020  . Asthma 11/04/2019  . Subclinical hyperthyroidism 02/11/2019  . Allergic rhinitis 08/16/2018  . Essential hypertension 07/17/2018  . Insomnia 07/17/2018  . Situational mixed anxiety and depressive disorder 07/17/2018  Past Medical History:  Diagnosis Date  . Asthma   . Hypertension     Relevant past medical, surgical, family and social history reviewed and updated as indicated. Interim medical history since our last visit reviewed.  Review of Systems Per HPI unless specifically indicated above     Objective:    BP (!) 134/100   Pulse 83   Temp 97.9 F (36.6 C)   Ht 5\' 2"  (1.575 m)    Wt 157 lb (71.2 kg)   SpO2 99%   BMI 28.72 kg/m   Wt Readings from Last 3 Encounters:  06/22/20 157 lb (71.2 kg)  01/10/20 156 lb 8 oz (71 kg)  11/04/19 154 lb (69.9 kg)    Physical Exam Vitals and nursing note reviewed.  Constitutional:      General: She is not in acute distress.    Appearance: Normal appearance. She is not toxic-appearing.  HENT:     Head: Normocephalic and atraumatic.  Eyes:     General: No scleral icterus.       Right eye: No discharge.        Left eye: No discharge.     Extraocular Movements: Extraocular movements intact.  Cardiovascular:     Rate and Rhythm: Normal rate and regular rhythm.     Heart sounds: Normal heart sounds. No murmur heard.   Pulmonary:     Effort: Pulmonary effort is normal. No respiratory distress.     Breath sounds: Normal breath sounds. No wheezing, rhonchi or rales.  Musculoskeletal:        General: Normal range of motion.     Cervical back: Normal range of motion.     Right lower leg: No edema.     Left lower leg: No edema.  Lymphadenopathy:     Cervical: No cervical adenopathy.  Skin:    General: Skin is warm and dry.     Capillary Refill: Capillary refill takes less than 2 seconds.     Coloration: Skin is not jaundiced or pale.     Findings: No erythema.  Neurological:     Mental Status: She is alert and oriented to person, place, and time.     Motor: No weakness.     Gait: Gait normal.  Psychiatric:        Mood and Affect: Mood normal.        Behavior: Behavior normal.        Thought Content: Thought content normal.        Judgment: Judgment normal.        Assessment & Plan:   Problem List Items Addressed This Visit      Cardiovascular and Mediastinum   Essential hypertension - Primary    Blood pressure elevated above goal of less than 130/80 today in clinic.  Given already taking metoprolol 50 mg twice daily and is trying to increase physical activity, will restart lisinopril-hydrochlorothiazide 10-12.5  mg daily.  CBC, and electrolytes and kidney function checked today.  Follow-up in 4 weeks.  Continue to check blood pressure at home and notify with blood pressure less than 100/60 or consistently greater than 130/80.      Relevant Medications   metoprolol tartrate (LOPRESSOR) 50 MG tablet   lisinopril-hydrochlorothiazide (ZESTORETIC) 10-12.5 MG tablet   Other Relevant Orders   Lipid panel   COMPLETE METABOLIC PANEL WITH GFR   CBC with Differential/Platelet     Other   Insomnia    Chronic.  Uses trazodone 25 to 50 mg  nightly as needed and this helps significantly with sleep.  We will continue this medication-refills given.  Follow-up in 6 months.      Relevant Medications   traZODone (DESYREL) 50 MG tablet   Other Relevant Orders   CBC with Differential/Platelet    Other Visit Diagnoses    History of vitamin D deficiency       Relevant Orders   VITAMIN D 25 Hydroxy (Vit-D Deficiency, Fractures)   Family history of diabetes mellitus       Relevant Orders   Hemoglobin A1c       Follow up plan: Return in about 4 weeks (around 07/20/2020) for BP follow up.

## 2020-06-22 NOTE — Assessment & Plan Note (Signed)
Chronic.  Uses trazodone 25 to 50 mg nightly as needed and this helps significantly with sleep.  We will continue this medication-refills given.  Follow-up in 6 months.

## 2020-06-22 NOTE — Assessment & Plan Note (Signed)
Blood pressure elevated above goal of less than 130/80 today in clinic.  Given already taking metoprolol 50 mg twice daily and is trying to increase physical activity, will restart lisinopril-hydrochlorothiazide 10-12.5 mg daily.  CBC, and electrolytes and kidney function checked today.  Follow-up in 4 weeks.  Continue to check blood pressure at home and notify with blood pressure less than 100/60 or consistently greater than 130/80.

## 2020-06-23 LAB — CBC WITH DIFFERENTIAL/PLATELET
Absolute Monocytes: 601 cells/uL (ref 200–950)
Basophils Absolute: 59 cells/uL (ref 0–200)
Basophils Relative: 0.9 %
Eosinophils Absolute: 172 cells/uL (ref 15–500)
Eosinophils Relative: 2.6 %
HCT: 39.3 % (ref 35.0–45.0)
Hemoglobin: 12.1 g/dL (ref 11.7–15.5)
Lymphs Abs: 2581 cells/uL (ref 850–3900)
MCH: 23.9 pg — ABNORMAL LOW (ref 27.0–33.0)
MCHC: 30.8 g/dL — ABNORMAL LOW (ref 32.0–36.0)
MCV: 77.5 fL — ABNORMAL LOW (ref 80.0–100.0)
MPV: 9.7 fL (ref 7.5–12.5)
Monocytes Relative: 9.1 %
Neutro Abs: 3188 cells/uL (ref 1500–7800)
Neutrophils Relative %: 48.3 %
Platelets: 423 10*3/uL — ABNORMAL HIGH (ref 140–400)
RBC: 5.07 10*6/uL (ref 3.80–5.10)
RDW: 14.6 % (ref 11.0–15.0)
Total Lymphocyte: 39.1 %
WBC: 6.6 10*3/uL (ref 3.8–10.8)

## 2020-06-23 LAB — COMPLETE METABOLIC PANEL WITH GFR
AG Ratio: 1.4 (calc) (ref 1.0–2.5)
ALT: 10 U/L (ref 6–29)
AST: 12 U/L (ref 10–30)
Albumin: 4.2 g/dL (ref 3.6–5.1)
Alkaline phosphatase (APISO): 107 U/L (ref 31–125)
BUN: 11 mg/dL (ref 7–25)
CO2: 26 mmol/L (ref 20–32)
Calcium: 9.8 mg/dL (ref 8.6–10.2)
Chloride: 104 mmol/L (ref 98–110)
Creat: 0.8 mg/dL (ref 0.50–1.10)
GFR, Est African American: 111 mL/min/{1.73_m2} (ref >=60)
GFR, Est Non African American: 96 mL/min/{1.73_m2} (ref >=60)
Globulin: 2.9 g/dL (calc) (ref 1.9–3.7)
Glucose, Bld: 78 mg/dL (ref 65–99)
Potassium: 4.2 mmol/L (ref 3.5–5.3)
Sodium: 138 mmol/L (ref 135–146)
Total Bilirubin: 0.4 mg/dL (ref 0.2–1.2)
Total Protein: 7.1 g/dL (ref 6.1–8.1)

## 2020-06-23 LAB — HEMOGLOBIN A1C
Hgb A1c MFr Bld: 5.3 % of total Hgb (ref <5.7)
Mean Plasma Glucose: 105 mg/dL
eAG (mmol/L): 5.8 mmol/L

## 2020-06-23 LAB — LIPID PANEL
Cholesterol: 157 mg/dL (ref <200)
HDL: 52 mg/dL (ref >=50)
LDL Cholesterol (Calc): 91 mg/dL (calc)
Non-HDL Cholesterol (Calc): 105 mg/dL (calc) (ref <130)
Total CHOL/HDL Ratio: 3 (calc) (ref <5.0)
Triglycerides: 46 mg/dL (ref <150)

## 2020-06-23 LAB — VITAMIN D 25 HYDROXY (VIT D DEFICIENCY, FRACTURES): Vit D, 25-Hydroxy: 23 ng/mL — ABNORMAL LOW (ref 30–100)

## 2020-07-20 ENCOUNTER — Ambulatory Visit: Payer: BC Managed Care – PPO | Admitting: Nurse Practitioner

## 2020-07-20 ENCOUNTER — Other Ambulatory Visit: Payer: Self-pay

## 2020-07-20 VITALS — BP 160/100 | HR 74 | Temp 98.4°F | Ht 62.0 in | Wt 158.8 lb

## 2020-07-20 DIAGNOSIS — I10 Essential (primary) hypertension: Secondary | ICD-10-CM | POA: Diagnosis not present

## 2020-07-20 DIAGNOSIS — H9201 Otalgia, right ear: Secondary | ICD-10-CM | POA: Diagnosis not present

## 2020-07-20 MED ORDER — LISINOPRIL-HYDROCHLOROTHIAZIDE 20-25 MG PO TABS: 1.0000 | ORAL_TABLET | Freq: Every day | ORAL | 0 refills | Status: DC

## 2020-07-20 NOTE — Progress Notes (Signed)
Subjective:    Patient ID: Diana [name], female    DOB: [birth year], 35 y.o.   MRN: [id number]  HPI: Diana [name] is a 35 y.o. female presenting for blood pressure follow-up  Chief Complaint  Patient presents with   Hypertension    Bp follow up, Still having pain in the right ear.   HYPERTENSION At last appointment, we restarted lisinopril-hydrochlorothiazide 10-12.5.  She is tolerating this medication well. Hypertension status: uncontrolled  Satisfied with current treatment? no Duration of hypertension: chronic BP monitoring frequency:  a few times a week BP range: 140/90 BP medication side effects:  no Medication compliance: excellent Aspirin: no Recurrent headaches: yes Visual changes: no Palpitations: no Dyspnea: no Chest pain: no Lower extremity edema: no Dizzy/lightheaded: no  EAR PAIN Duration: months Involved ear(s): right Severity:  moderate, worse when she lays on it  Quality:  sharp Fever: no Otorrhea: yes - ear wax colored/brown Upper respiratory infection symptoms: no Pruritus: yes Hearing loss: yes Water immersion no Using Q-tips: no Recurrent otitis media: no Status: stable Treatments attempted: z pack, nasal steroid, antihistamine  Allergies  Allergen Reactions   Pseudoephedrine Palpitations    Raises BP also   Norvasc [Amlodipine Besylate] Itching   Amoxicillin Itching and Rash    Outpatient Encounter Medications as of 07/20/2020  Medication Sig   albuterol (VENTOLIN HFA) 108 (90 Base) MCG/ACT inhaler INHALE 2 PUFFS INTO THE LUNGS EVERY 4 HOURS AS NEEDED FOR WHEEZE OR FOR SHORTNESS OF BREATH   BREO ELLIPTA 100-25 MCG/INH AEPB Inhale 1 puff into the lungs daily.   cetirizine (ZYRTEC) 10 MG tablet Take 1 tablet (10 mg total) by mouth daily.   cyclobenzaprine (FLEXERIL) 5 MG tablet Take 1 tablet (5 mg total) by mouth 3 (three) times daily as needed for muscle spasms.   fluticasone (FLONASE) 50 MCG/ACT nasal spray Place 2 sprays into both  nostrils daily.   INCASSIA 0.35 MG tablet Take 1 tablet by mouth daily.   lisinopril-hydrochlorothiazide (ZESTORETIC) 20-25 MG tablet Take 1 tablet by mouth daily.   metoprolol tartrate (LOPRESSOR) 50 MG tablet Take 1 tablet (50 mg total) by mouth 2 (two) times daily.   montelukast (SINGULAIR) 10 MG tablet Take 1 tablet (10 mg total) by mouth at bedtime.   Multiple Vitamins-Minerals (HAIR VITAMINS) TABS 1 tablet daily   Prenatal Vit-Fe Fumarate-FA (PRENATAL VITAMINS) 28-0.8 MG TABS 1 tablet daily   traZODone (DESYREL) 50 MG tablet Take 0.5-1 tablets (25-50 mg total) by mouth at bedtime as needed for sleep.   [DISCONTINUED] lisinopril-hydrochlorothiazide (ZESTORETIC) 10-12.5 MG tablet Take 1 tablet by mouth daily.   No facility-administered encounter medications on file as of 07/20/2020.    Patient Active Problem List   Diagnosis Date Noted   Right thyroid nodule 01/10/2020   Asthma 11/04/2019   Subclinical hyperthyroidism 02/11/2019   Allergic rhinitis 08/16/2018   Essential hypertension 07/17/2018   Insomnia 07/17/2018   Situational mixed anxiety and depressive disorder 07/17/2018    Past Medical History:  Diagnosis Date   Asthma    Hypertension     Relevant past medical, surgical, family and social history reviewed and updated as indicated. Interim medical history since our last visit reviewed.  Review of Systems Per HPI unless specifically indicated above     Objective:    BP (!) 160/100   Pulse 74   Temp 98.4 F (36.9 C)   Ht 5\' 2"  (1.575 m)   Wt 158 lb 12.8 oz (72 kg)   SpO2 99%  BMI 29.04 kg/m   Wt Readings from Last 3 Encounters:  07/20/20 158 lb 12.8 oz (72 kg)  06/22/20 157 lb (71.2 kg)  01/10/20 156 lb 8 oz (71 kg)    Physical Exam Vitals and nursing note reviewed.  Constitutional:      General: She is not in acute distress.    Appearance: Normal appearance. She is obese. She is not toxic-appearing.  HENT:     Head: Normocephalic and atraumatic.      Right Ear: Tympanic membrane, ear canal and external ear normal. There is no impacted cerumen.     Left Ear: Tympanic membrane, ear canal and external ear normal. There is no impacted cerumen.  Eyes:     General: No scleral icterus.    Extraocular Movements: Extraocular movements intact.  Cardiovascular:     Rate and Rhythm: Regular rhythm.     Heart sounds: Normal heart sounds. No murmur heard. Pulmonary:     Effort: Pulmonary effort is normal. No respiratory distress.     Breath sounds: Normal breath sounds. No wheezing, rhonchi or rales.  Skin:    General: Skin is warm and dry.     Coloration: Skin is not jaundiced or pale.     Findings: No erythema.  Neurological:     Mental Status: She is alert and oriented to person, place, and time.     Motor: No weakness.     Gait: Gait normal.  Psychiatric:        Mood and Affect: Mood normal.        Behavior: Behavior normal.        Thought Content: Thought content normal.        Judgment: Judgment normal.      Assessment & Plan:  1. Essential hypertension Chronic, remains elevated.  Will increase lisinopril hydrochlorothiazide to 20-25.  Follow-up 4 weeks.  BMET today to check kidney function and electrolytes.  Continue monitoring blood pressure at home.  Continue DASH diet.  - BASIC METABOLIC PANEL WITH GFR - lisinopril-hydrochlorothiazide (ZESTORETIC) 20-25 MG tablet; Take 1 tablet by mouth daily.  Dispense: 90 tablet; Refill: 0  2. Right ear pain Acute.  Examination today is unremarkable.  I do not know what is causing her ear pain and we have tried floanse and antihistamine without relief.  Will place referral to ENT today for further evaluation.  - Ambulatory referral to ENT    Follow up plan: Return for 1 month BP f/u.

## 2020-07-21 ENCOUNTER — Encounter: Payer: Self-pay | Admitting: Nurse Practitioner

## 2020-07-21 LAB — BASIC METABOLIC PANEL WITH GFR
BUN: 16 mg/dL (ref 7–25)
CO2: 28 mmol/L (ref 20–32)
Calcium: 9.6 mg/dL (ref 8.6–10.2)
Chloride: 103 mmol/L (ref 98–110)
Creat: 0.85 mg/dL (ref 0.50–1.10)
GFR, Est African American: 103 mL/min/{1.73_m2} (ref >=60)
GFR, Est Non African American: 89 mL/min/{1.73_m2} (ref >=60)
Glucose, Bld: 62 mg/dL — ABNORMAL LOW (ref 65–99)
Potassium: 3.6 mmol/L (ref 3.5–5.3)
Sodium: 140 mmol/L (ref 135–146)

## 2020-07-21 NOTE — Assessment & Plan Note (Signed)
Chronic, remains elevated.  Will increase lisinopril hydrochlorothiazide to 20-25.  Follow-up 4 weeks.  BMET today to check kidney function and electrolytes.  Continue monitoring blood pressure at home.  Continue DASH diet.

## 2020-07-24 ENCOUNTER — Encounter: Payer: Self-pay | Admitting: Nurse Practitioner

## 2020-08-17 ENCOUNTER — Ambulatory Visit: Payer: BC Managed Care – PPO | Admitting: Nurse Practitioner

## 2020-08-17 NOTE — Progress Notes (Deleted)
Subjective:    Patient ID: Michalla Ringer, female    DOB: 1985-06-13, 35 y.o.   MRN: [id number]  HPI: Aarya Robinson is a 35 y.o. female presenting for blood pressure follow up.  No chief complaint on file.  HYPERTENSION Metoprolol - hair loss? Hypertension status: {Blank single:19197::"controlled","uncontrolled","better","worse","exacerbated","stable"}  Satisfied with current treatment? {Blank single:19197::"yes","no"} Duration of hypertension: {Blank single:19197::"chronic","months","years"} BP monitoring frequency:  {Blank single:19197::"not checking","rarely","daily","weekly","monthly","a few times a day","a few times a week","a few times a month"} BP range:  BP medication side effects:  {Blank single:19197::"yes","no"} Medication compliance: Aspirin: {Blank single:19197::"yes","no"} Recurrent headaches: {Blank single:19197::"yes","no"} Visual changes: {Blank single:19197::"yes","no"} Palpitations: {Blank single:19197::"yes","no"} Dyspnea: {Blank single:19197::"yes","no"} Chest pain: {Blank single:19197::"yes","no"} Lower extremity edema: {Blank single:19197::"yes","no"} Dizzy/lightheaded: {Blank single:19197::"yes","no"}   Allergies  Allergen Reactions   Pseudoephedrine Palpitations    Raises BP also   Norvasc [Amlodipine Besylate] Itching   Amoxicillin Itching and Rash    Outpatient Encounter Medications as of 08/17/2020  Medication Sig   albuterol (VENTOLIN HFA) 108 (90 Base) MCG/ACT inhaler INHALE 2 PUFFS INTO THE LUNGS EVERY 4 HOURS AS NEEDED FOR WHEEZE OR FOR SHORTNESS OF BREATH   BREO ELLIPTA 100-25 MCG/INH AEPB Inhale 1 puff into the lungs daily.   cetirizine (ZYRTEC) 10 MG tablet Take 1 tablet (10 mg total) by mouth daily.   cyclobenzaprine (FLEXERIL) 5 MG tablet Take 1 tablet (5 mg total) by mouth 3 (three) times daily as needed for muscle spasms.   fluticasone (FLONASE) 50 MCG/ACT nasal spray Place 2 sprays into both nostrils daily.   INCASSIA 0.35 MG tablet  Take 1 tablet by mouth daily.   lisinopril-hydrochlorothiazide (ZESTORETIC) 20-25 MG tablet Take 1 tablet by mouth daily.   metoprolol tartrate (LOPRESSOR) 50 MG tablet Take 1 tablet (50 mg total) by mouth 2 (two) times daily.   montelukast (SINGULAIR) 10 MG tablet Take 1 tablet (10 mg total) by mouth at bedtime.   Multiple Vitamins-Minerals (HAIR VITAMINS) TABS 1 tablet daily   Prenatal Vit-Fe Fumarate-FA (PRENATAL VITAMINS) 28-0.8 MG TABS 1 tablet daily   traZODone (DESYREL) 50 MG tablet Take 0.5-1 tablets (25-50 mg total) by mouth at bedtime as needed for sleep.   No facility-administered encounter medications on file as of 08/17/2020.    Patient Active Problem List   Diagnosis Date Noted   Right thyroid nodule 01/10/2020   Asthma 11/04/2019   Subclinical hyperthyroidism 02/11/2019   Allergic rhinitis 08/16/2018   Essential hypertension 07/17/2018   Insomnia 07/17/2018   Situational mixed anxiety and depressive disorder 07/17/2018    Past Medical History:  Diagnosis Date   Asthma    Hypertension     Relevant past medical, surgical, family and social history reviewed and updated as indicated. Interim medical history since our last visit reviewed.  Review of Systems Per HPI unless specifically indicated above     Objective:    There were no vitals taken for this visit.  Wt Readings from Last 3 Encounters:  07/20/20 158 lb 12.8 oz (72 kg)  06/22/20 157 lb (71.2 kg)  01/10/20 156 lb 8 oz (71 kg)    Physical Exam  Results for orders placed or performed in visit on 07/20/20  BASIC METABOLIC PANEL WITH GFR  Result Value Ref Range   Glucose, Bld 62 (L) 65 - 99 mg/dL   BUN 16 7 - 25 mg/dL   Creat 4.09 8.11 - 9.14 mg/dL   GFR, Est Non African American 89 > OR = 60 mL/min/1.80m2   GFR, Est African American 103 >  OR = 60 mL/min/1.68m2   BUN/Creatinine Ratio NOT APPLICABLE 6 - 22 (calc)   Sodium 140 135 - 146 mmol/L   Potassium 3.6 3.5 - 5.3 mmol/L   Chloride 103 98 - 110  mmol/L   CO2 28 20 - 32 mmol/L   Calcium 9.6 8.6 - 10.2 mg/dL      Assessment & Plan:   Problem List Items Addressed This Visit   None    Follow up plan: No follow-ups on file.

## 2020-12-07 ENCOUNTER — Other Ambulatory Visit: Payer: Self-pay | Admitting: Nurse Practitioner

## 2020-12-07 DIAGNOSIS — I10 Essential (primary) hypertension: Secondary | ICD-10-CM

## 2020-12-07 NOTE — Telephone Encounter (Signed)
Per chart pt was to follow up in 7/22, no appt has been scheduled. Refill sent for 1 month supply, letter sent to pt requesting to schedule f/u visit for further refills.

## 2020-12-18 ENCOUNTER — Other Ambulatory Visit: Payer: Self-pay | Admitting: Nurse Practitioner

## 2020-12-18 DIAGNOSIS — F5101 Primary insomnia: Secondary | ICD-10-CM

## 2020-12-20 ENCOUNTER — Ambulatory Visit: Payer: BC Managed Care – PPO | Admitting: Nurse Practitioner

## 2020-12-20 ENCOUNTER — Other Ambulatory Visit: Payer: Self-pay

## 2020-12-20 ENCOUNTER — Encounter: Payer: Self-pay | Admitting: Nurse Practitioner

## 2020-12-20 VITALS — BP 142/98 | HR 67 | Temp 97.3°F | Ht 62.0 in | Wt 157.4 lb

## 2020-12-20 DIAGNOSIS — I10 Essential (primary) hypertension: Secondary | ICD-10-CM | POA: Diagnosis not present

## 2020-12-20 NOTE — Assessment & Plan Note (Signed)
Chronic.  BP remains elevated today in office.  Goal less than 130/80 for this patient.  I have asked the patient to check her blood pressure a few times per week 1 hour after taking medication and report the readings to me in 2 weeks.  If still elevated above 130/80, we will plan to increase lisinopril to 40; keep HCTZ at 25.  Check BMET today for electrolytes and kidney function.  Follow up 6 months or sooner if BP remains elevated.

## 2020-12-20 NOTE — Progress Notes (Signed)
Subjective:    Patient ID: Diana [name], female    DOB: 05-11-1985, 35 y.o.   MRN: [id number]  HPI: Brande Uncapher is a 35 y.o. female presenting for blood pressure follow up.  Chief Complaint  Patient presents with   Follow-up    Follow up cold all the time    HYPERTENSION Hypertension status: uncontrolled  BP monitoring frequency:  not checking BP range: not been checking at home Medication compliance: excellent Aspirin: no Recurrent headaches: no Visual changes: no Palpitations: no Dyspnea: no Chest pain: no Lower extremity edema: no Dizzy/lightheaded: no Water: drinks water all day Dietary: mostly eats out for lunch - cooks dinner at home  Allergies  Allergen Reactions   Pseudoephedrine Palpitations    Raises BP also   Norvasc [Amlodipine Besylate] Itching   Amoxicillin Itching and Rash    Outpatient Encounter Medications as of 12/20/2020  Medication Sig   albuterol (VENTOLIN HFA) 108 (90 Base) MCG/ACT inhaler INHALE 2 PUFFS INTO THE LUNGS EVERY 4 HOURS AS NEEDED FOR WHEEZE OR FOR SHORTNESS OF BREATH   BREO ELLIPTA 100-25 MCG/INH AEPB Inhale 1 puff into the lungs daily.   cetirizine (ZYRTEC) 10 MG tablet Take 1 tablet (10 mg total) by mouth daily.   cyclobenzaprine (FLEXERIL) 5 MG tablet Take 1 tablet (5 mg total) by mouth 3 (three) times daily as needed for muscle spasms.   fluticasone (FLONASE) 50 MCG/ACT nasal spray Place 2 sprays into both nostrils daily.   INCASSIA 0.35 MG tablet Take 1 tablet by mouth daily.   lisinopril-hydrochlorothiazide (ZESTORETIC) 20-25 MG tablet TAKE 1 TABLET BY MOUTH EVERY DAY   metoprolol tartrate (LOPRESSOR) 50 MG tablet Take 1 tablet (50 mg total) by mouth 2 (two) times daily.   montelukast (SINGULAIR) 10 MG tablet Take 1 tablet (10 mg total) by mouth at bedtime.   Prenatal Vit-Fe Fumarate-FA (PRENATAL VITAMINS) 28-0.8 MG TABS 1 tablet daily   traZODone (DESYREL) 50 MG tablet TAKE 0.5-1 TABLETS BY MOUTH AT BEDTIME AS NEEDED FOR  SLEEP.   Multiple Vitamins-Minerals (HAIR VITAMINS) TABS 1 tablet daily (Patient not taking: Reported on 12/20/2020)   No facility-administered encounter medications on file as of 12/20/2020.    Patient Active Problem List   Diagnosis Date Noted   Right thyroid nodule 01/10/2020   Asthma 11/04/2019   Subclinical hyperthyroidism 02/11/2019   Allergic rhinitis 08/16/2018   Essential hypertension 07/17/2018   Insomnia 07/17/2018   Situational mixed anxiety and depressive disorder 07/17/2018    Past Medical History:  Diagnosis Date   Asthma    Hypertension     Relevant past medical, surgical, family and social history reviewed and updated as indicated. Interim medical history since our last visit reviewed.  Review of Systems Per HPI unless specifically indicated above     Objective:    BP (!) 142/98 (BP Location: Left Arm, Cuff Size: Normal)   Pulse 67   Temp (!) 97.3 F (36.3 C)   Ht 5\' 2"  (1.575 m)   Wt 157 lb 6.4 oz (71.4 kg)   LMP 12/02/2020 (Approximate)   SpO2 99%   BMI 28.79 kg/m   Wt Readings from Last 3 Encounters:  12/20/20 157 lb 6.4 oz (71.4 kg)  07/20/20 158 lb 12.8 oz (72 kg)  06/22/20 157 lb (71.2 kg)    Physical Exam Vitals and nursing note reviewed.  Constitutional:      General: She is not in acute distress.    Appearance: Normal appearance. She is obese. She  is not toxic-appearing.  HENT:     Head: Normocephalic and atraumatic.  Eyes:     General: No scleral icterus.    Extraocular Movements: Extraocular movements intact.  Cardiovascular:     Rate and Rhythm: Normal rate and regular rhythm.     Heart sounds: Normal heart sounds. No murmur heard. Pulmonary:     Effort: Pulmonary effort is normal. No respiratory distress.     Breath sounds: Normal breath sounds. No wheezing, rhonchi or rales.  Musculoskeletal:        General: No swelling or tenderness. Normal range of motion.     Right lower leg: No edema.     Left lower leg: No edema.   Skin:    General: Skin is warm and dry.     Capillary Refill: Capillary refill takes less than 2 seconds.     Coloration: Skin is not jaundiced or pale.     Findings: No bruising or erythema.  Neurological:     General: No focal deficit present.     Mental Status: She is alert and oriented to person, place, and time.     Motor: No weakness.     Gait: Gait normal.  Psychiatric:        Mood and Affect: Mood normal.        Behavior: Behavior normal.        Thought Content: Thought content normal.        Judgment: Judgment normal.      Assessment & Plan:   Problem List Items Addressed This Visit       Cardiovascular and Mediastinum   Essential hypertension - Primary    Chronic.  BP remains elevated today in office.  Goal less than 130/80 for this patient.  I have asked the patient to check her blood pressure a few times per week 1 hour after taking medication and report the readings to me in 2 weeks.  If still elevated above 130/80, we will plan to increase lisinopril to 40; keep HCTZ at 25.  Check BMET today for electrolytes and kidney function.  Follow up 6 months or sooner if BP remains elevated.      Relevant Orders   BASIC METABOLIC PANEL WITH GFR     Follow up plan: Return if symptoms worsen or fail to improve.

## 2020-12-21 LAB — BASIC METABOLIC PANEL WITH GFR
BUN: 14 mg/dL (ref 7–25)
CO2: 25 mmol/L (ref 20–32)
Calcium: 9.5 mg/dL (ref 8.6–10.2)
Chloride: 104 mmol/L (ref 98–110)
Creat: 0.72 mg/dL (ref 0.50–0.97)
Glucose, Bld: 81 mg/dL (ref 65–99)
Potassium: 4 mmol/L (ref 3.5–5.3)
Sodium: 138 mmol/L (ref 135–146)
eGFR: 111 mL/min/{1.73_m2} (ref >=60)

## 2021-01-05 ENCOUNTER — Other Ambulatory Visit: Payer: Self-pay | Admitting: Nurse Practitioner

## 2021-01-05 DIAGNOSIS — I10 Essential (primary) hypertension: Secondary | ICD-10-CM

## 2021-01-08 ENCOUNTER — Ambulatory Visit: Payer: BLUE CROSS/BLUE SHIELD | Admitting: Internal Medicine

## 2021-01-10 DIAGNOSIS — Z124 Encounter for screening for malignant neoplasm of cervix: Secondary | ICD-10-CM | POA: Diagnosis not present

## 2021-01-10 DIAGNOSIS — Z114 Encounter for screening for human immunodeficiency virus [HIV]: Secondary | ICD-10-CM | POA: Diagnosis not present

## 2021-01-10 DIAGNOSIS — Z118 Encounter for screening for other infectious and parasitic diseases: Secondary | ICD-10-CM | POA: Diagnosis not present

## 2021-01-10 DIAGNOSIS — Z01419 Encounter for gynecological examination (general) (routine) without abnormal findings: Secondary | ICD-10-CM | POA: Diagnosis not present

## 2021-01-10 DIAGNOSIS — Z1159 Encounter for screening for other viral diseases: Secondary | ICD-10-CM | POA: Diagnosis not present

## 2021-01-10 DIAGNOSIS — Z6828 Body mass index (BMI) 28.0-28.9, adult: Secondary | ICD-10-CM | POA: Diagnosis not present

## 2021-01-10 DIAGNOSIS — Z01411 Encounter for gynecological examination (general) (routine) with abnormal findings: Secondary | ICD-10-CM | POA: Diagnosis not present

## 2021-01-10 DIAGNOSIS — Z113 Encounter for screening for infections with a predominantly sexual mode of transmission: Secondary | ICD-10-CM | POA: Diagnosis not present

## 2021-01-10 LAB — HM PAP SMEAR

## 2021-01-21 ENCOUNTER — Other Ambulatory Visit: Payer: Self-pay | Admitting: Nurse Practitioner

## 2021-01-21 DIAGNOSIS — I10 Essential (primary) hypertension: Secondary | ICD-10-CM

## 2021-02-05 ENCOUNTER — Ambulatory Visit: Payer: BC Managed Care – PPO | Admitting: Internal Medicine

## 2021-02-05 NOTE — Progress Notes (Deleted)
Name: Diana [name]  MRN/ DOB: [id number], February 12, 1985    Age/ Sex: 36 y.o., female     PCP: Valentino Nose, NP   Reason for Endocrinology Evaluation: Subclinical hyperthyroidism     Initial Endocrinology Clinic Visit: 01/10/2020    PATIENT IDENTIFIER: [name] is a 36 y.o., female with a past medical history of ***. She has followed with [facility] since 01/10/2020 for consultative assistance with management of her subclinical hyperthyroidism.   HISTORICAL SUMMARY:     Pt has been noted to have a low TSH in 07/2018 with a TSH nadir of 0.28 uIU/mL in 08/2018. She was experiencing palpitations at the time as well as heat intolerance.      Repeat labs by 11/2019 were normal   Of note, the pt noted to have an incidental finding of right thyroid partially calcified nodule 1.4x1.8 cm 02/2019  I have ordered a thyroid ultrasound 01/2020 , per chart notes the pt refused the procedure.    At the age of 69 , she was seen by ENT for a thyroid swelling. She had " blood drawn " of the thyroid nodule, no follow up on this since.     Denies FH of thyroid disease   SUBJECTIVE:    Today (02/05/2021):  [name] is here for thyroid nodule and subclinical hyperthyroidism.       HISTORY:  Past Medical History:  Past Medical History:  Diagnosis Date   Asthma    Hypertension    Past Surgical History: No past surgical history on file. Social History:  reports that she has never smoked. She has never used smokeless tobacco. She reports that she does not drink alcohol and does not use drugs. Family History:  Family History  Problem Relation Age of Onset   Hypertension Mother    Hypertension Father      HOME MEDICATIONS: Allergies as of 02/05/2021       Reactions   Pseudoephedrine Palpitations   Raises BP also   Norvasc [amlodipine Besylate] Itching   Amoxicillin Itching, Rash        Medication List        Accurate as of February 05, 2021  7:44 AM.  If you have any questions, ask your nurse or doctor.          albuterol 108 (90 Base) MCG/ACT inhaler Commonly known as: VENTOLIN HFA INHALE 2 PUFFS INTO THE LUNGS EVERY 4 HOURS AS NEEDED FOR WHEEZE OR FOR SHORTNESS OF BREATH   Breo Ellipta 100-25 MCG/ACT Aepb Generic drug: fluticasone furoate-vilanterol Inhale 1 puff into the lungs daily.   cetirizine 10 MG tablet Commonly known as: ZYRTEC Take 1 tablet (10 mg total) by mouth daily.   cyclobenzaprine 5 MG tablet Commonly known as: FLEXERIL Take 1 tablet (5 mg total) by mouth 3 (three) times daily as needed for muscle spasms.   fluticasone 50 MCG/ACT nasal spray Commonly known as: FLONASE Place 2 sprays into both nostrils daily.   Hair Vitamins Tabs 1 tablet daily   Incassia 0.35 MG tablet Generic drug: norethindrone Take 1 tablet by mouth daily.   lisinopril-hydrochlorothiazide 20-25 MG tablet Commonly known as: ZESTORETIC TAKE 1 TABLET BY MOUTH EVERY DAY   metoprolol tartrate 50 MG tablet Commonly known as: LOPRESSOR Take 1 tablet (50 mg total) by mouth 2 (two) times daily.   montelukast 10 MG tablet Commonly known as: SINGULAIR Take 1 tablet (10 mg total) by mouth at bedtime.   Prenatal Vitamins 28-0.8 MG  Tabs 1 tablet daily   traZODone 50 MG tablet Commonly known as: DESYREL TAKE 0.5-1 TABLETS BY MOUTH AT BEDTIME AS NEEDED FOR SLEEP.          OBJECTIVE:   PHYSICAL EXAM: VS: There were no vitals taken for this visit.   EXAM: General: Pt appears well and is in NAD  Hydration: Well-hydrated with moist mucous membranes and good skin turgor  Eyes: External eye exam normal without stare, lid lag or exophthalmos.  EOM intact.  PERRL.  Ears, Nose, Throat: Hearing: Grossly intact bilaterally Dental: Good dentition  Throat: Clear without mass, erythema or exudate  Neck: General: Supple without adenopathy. Thyroid: Thyroid size normal.  No goiter or nodules appreciated. No thyroid bruit.  Lungs: Clear  with good BS bilat with no rales, rhonchi, or wheezes  Heart: Auscultation: RRR.  Abdomen: Normoactive bowel sounds, soft, nontender, without masses or organomegaly palpable  Extremities: Gait and station: Normal gait  Digits and nails: No clubbing, cyanosis, petechiae, or nodes Head and neck: Normal alignment and mobility BL UE: Normal ROM and strength. BL LE: No pretibial edema normal ROM and strength.  Skin: Hair: Texture and amount normal with gender appropriate distribution Skin Inspection: No rashes, acanthosis nigricans/skin tags. No lipohypertrophy Skin Palpation: Skin temperature, texture, and thickness normal to palpation  Neuro: Cranial nerves: II - XII grossly intact  Cerebellar: Normal coordination and movement; no tremor Motor: Normal strength throughout DTRs: 2+ and symmetric in UE without delay in relaxation phase  Mental Status: Judgment, insight: Intact Orientation: Oriented to time, place, and person Memory: Intact for recent and remote events Mood and affect: No depression, anxiety, or agitation     DATA REVIEWED: ***    ASSESSMENT / PLAN / RECOMMENDATIONS:   Right Thyroid Nodule :    - This was an incidental finding on CT scan , will proceed with thyroid ultrasound  - No local neck symptoms    Subclinical Hyperthyroidism :    - Pt with non-specfic symptoms  - Unclear the cause but she does recall being on Biotin last year and I wonder if this has anything to do with her abnormal results. She has been off Biotin for 6 months and repeat labs in 11/2019 were normal - Repeat TFT's are normal     ***   Signed electronically by: Lyndle Herrlich, MD  [facility]  [facility] [address] [place], [place] [address] Phone: [phone] FAX: [phone]      CC: Valentino Nose, NP [address] [place] [address] Phone: [phone]  Fax: [phone]   Return to Endocrinology clinic as  below: Future Appointments  Date Time Provider Department Center  02/05/2021 10:50 AM Paisyn Guercio, Konrad Dolores, MD [id number]

## 2021-02-22 ENCOUNTER — Emergency Department (HOSPITAL_COMMUNITY)
Admission: EM | Admit: 2021-02-22 | Discharge: 2021-02-22 | Disposition: A | Discharge status: Home / Self Care | Payer: 59 | Attending: Emergency Medicine | Admitting: Emergency Medicine

## 2021-02-22 ENCOUNTER — Encounter (HOSPITAL_COMMUNITY): Payer: Self-pay | Admitting: *Deleted

## 2021-02-22 DIAGNOSIS — O020 Blighted ovum and nonhydatidiform mole: Secondary | ICD-10-CM | POA: Insufficient documentation

## 2021-02-22 DIAGNOSIS — N939 Abnormal uterine and vaginal bleeding, unspecified: Secondary | ICD-10-CM

## 2021-02-22 LAB — CBC WITH DIFFERENTIAL/PLATELET
Abs Immature Granulocytes: 0.04 10*3/uL (ref 0.00–0.07)
Basophils Absolute: 0.1 10*3/uL (ref 0.0–0.1)
Basophils Relative: 1 %
Eosinophils Absolute: 0.2 10*3/uL (ref 0.0–0.5)
Eosinophils Relative: 2 %
HCT: 31.6 % — ABNORMAL LOW (ref 36.0–46.0)
Hemoglobin: 10.1 g/dL — ABNORMAL LOW (ref 12.0–15.0)
Immature Granulocytes: 0 %
Lymphocytes Relative: 28 %
Lymphs Abs: 3 10*3/uL (ref 0.7–4.0)
MCH: 24.5 pg — ABNORMAL LOW (ref 26.0–34.0)
MCHC: 32 g/dL (ref 30.0–36.0)
MCV: 76.7 fL — ABNORMAL LOW (ref 80.0–100.0)
Monocytes Absolute: 0.7 10*3/uL (ref 0.1–1.0)
Monocytes Relative: 6 %
Neutro Abs: 6.8 10*3/uL (ref 1.7–7.7)
Neutrophils Relative %: 63 %
Platelets: 397 10*3/uL (ref 150–400)
RBC: 4.12 MIL/uL (ref 3.87–5.11)
RDW: 16.4 % — ABNORMAL HIGH (ref 11.5–15.5)
WBC: 10.8 10*3/uL — ABNORMAL HIGH (ref 4.0–10.5)
nRBC: 0 % (ref 0.0–0.2)

## 2021-02-22 LAB — URINALYSIS, ROUTINE W REFLEX MICROSCOPIC
Bilirubin Urine: NEGATIVE
Glucose, UA: NEGATIVE mg/dL
Ketones, ur: NEGATIVE mg/dL
Nitrite: NEGATIVE
Protein, ur: 30 mg/dL — AB
RBC / HPF: >50 RBC/hpf — ABNORMAL HIGH (ref 0–5)
Specific Gravity, Urine: 1.02 (ref 1.005–1.030)
pH: 6 (ref 5.0–8.0)

## 2021-02-22 LAB — BASIC METABOLIC PANEL
Anion gap: 7 (ref 5–15)
BUN: 9 mg/dL (ref 6–20)
CO2: 26 mmol/L (ref 22–32)
Calcium: 8.7 mg/dL — ABNORMAL LOW (ref 8.9–10.3)
Chloride: 106 mmol/L (ref 98–111)
Creatinine, Ser: 0.78 mg/dL (ref 0.44–1.00)
GFR, Estimated: >60 mL/min (ref >60)
Glucose, Bld: 101 mg/dL — ABNORMAL HIGH (ref 70–99)
Potassium: 3.6 mmol/L (ref 3.5–5.1)
Sodium: 139 mmol/L (ref 135–145)

## 2021-02-22 LAB — WET PREP, GENITAL
Clue Cells Wet Prep HPF POC: NONE SEEN
Sperm: NONE SEEN
Trich, Wet Prep: NONE SEEN
WBC, Wet Prep HPF POC: <10 (ref <10)
Yeast Wet Prep HPF POC: NONE SEEN

## 2021-02-22 LAB — I-STAT BETA HCG BLOOD, ED (MC, WL, AP ONLY): I-stat hCG, quantitative: >2000 m[IU]/mL — ABNORMAL HIGH (ref <5)

## 2021-02-22 MED ORDER — OXYCODONE-ACETAMINOPHEN 5-325 MG PO TABS
1.0000 | ORAL_TABLET | Freq: Once | ORAL | Status: AC
  Administered 2021-02-22: 1 via ORAL
  Filled 2021-02-22: qty 1

## 2021-02-22 MED ORDER — OXYCODONE-ACETAMINOPHEN 5-325 MG PO TABS: 1.0000 | ORAL_TABLET | Freq: Four times a day (QID) | ORAL | 0 refills | Status: DC | PRN

## 2021-02-22 NOTE — Discharge Instructions (Addendum)
Return to ED with any new or worsening symptoms such as abdominal pain, vaginal bleeding Please follow-up with your OB/GYN in the next 3 to 5 days for ongoing management I have prescribed you pain medication as discussed to the pharmacy requested

## 2021-02-22 NOTE — ED Triage Notes (Signed)
Abdominal pain x 2 days °

## 2021-02-22 NOTE — ED Provider Notes (Signed)
°  Physical Exam  BP (!) 188/97    Pulse 83    Temp 98.3 F (36.8 C) (Oral)    Resp 18    LMP 01/02/2021    SpO2 100%   Physical Exam Vitals and nursing note reviewed.  Constitutional:      General: She is not in acute distress.    Appearance: She is not toxic-appearing.  HENT:     Head: Normocephalic and atraumatic.  Cardiovascular:     Rate and Rhythm: Normal rate and regular rhythm.  Pulmonary:     Effort: No respiratory distress.  Abdominal:     Tenderness: There is abdominal tenderness in the right lower quadrant, suprapubic area and left lower quadrant.  Skin:    Capillary Refill: Capillary refill takes less than 2 seconds.     Coloration: Skin is not jaundiced or pale.  Neurological:     Mental Status: She is alert and oriented to person, place, and time.  Psychiatric:        Behavior: Behavior normal.    Procedures  Procedures  ED Course / MDM    Medical Decision Making Amount and/or Complexity of Data Reviewed Labs: ordered.  Risk Prescription drug management.   Patient signed out to me from provider PA-C Tammie Triplett. Please see Tammie's note for further details.   36 year old female presents to ED due to lower abdominal pain, vaginal bleeding, requesting pain control.  Patient recently diagnosed with "blighted ovum".  Patient states that she was seen for this complaint by her OB/GYN on Tuesday and had ultrasound performed.  Patient was given diagnosis of blighted ovum at this time.  Patient states that she attempted to control pain utilizing ibuprofen but the pain is not improved.  Patient denies any excess vaginal bleeding, states that it has been a few small clots at a time.   Patient patient work-up here is been largely unremarkable.  Per PA-C Triplett's plan, patient will be discharged due to largely negative work-up.  The patient will be given 5 oxycodone pills for pain management and advised to follow-up with her OB/GYN in the next 3 to 5 days.  The  patient states that she has an appointment with her OB/GYN for this coming week.  I have discussed the findings of the patient's work-up with the patient and she voices understanding.  The patient had all of her questions answered and she expressed satisfaction.  The patient was given return precautions and she voiced understanding.  The patient is stable for discharge.      Clent Ridges 02/22/21 2124    Eber Hong, MD 02/23/21 2251

## 2021-02-22 NOTE — ED Provider Notes (Signed)
[facility] EMERGENCY DEPARTMENT Provider Note   CSN: [id number] Arrival date & time: 02/22/21  1620     History  Chief Complaint  Patient presents with   Abdominal Pain   Vaginal Bleeding    Diana [name] is a 36 y.o. female.   Abdominal Pain Associated symptoms: vaginal bleeding   Associated symptoms: no chest pain, no nausea, no shortness of breath and no vomiting   Vaginal Bleeding Associated symptoms: abdominal pain   Associated symptoms: no dizziness and no nausea        Diana [name] is a 36 y.o. female G2, P0, LMP 01/02/2021 who presents to the Emergency Department complaining of lower abdominal pain and requesting pain control.  She began having vaginal bleeding on Tuesday.  She was seen by her OB/GYN on Tuesday and ultrasound was performed.  States she was told that she had a "blighted ovum."  She was prescribed ibuprofen for pain.  She took her last ibuprofen dose this morning and said the pain has not improved.  Her vaginal bleeding has been persistent, but states bleeding has not increased.  She is passing a few small clots of blood at times.  She describes pain as sharp with occasional cramping.  She denies any fever, chills, urine or bowel changes.     Home Medications Prior to Admission medications   Medication Sig Start Date End Date Taking? Authorizing Provider  albuterol (VENTOLIN HFA) 108 (90 Base) MCG/ACT inhaler INHALE 2 PUFFS INTO THE LUNGS EVERY 4 HOURS AS NEEDED FOR WHEEZE OR FOR SHORTNESS OF BREATH 10/11/19   [place], Velna Hatchet, MD  BREO ELLIPTA 100-25 MCG/INH AEPB Inhale 1 puff into the lungs daily. 11/21/19   [provider]  cetirizine (ZYRTEC) 10 MG tablet Take 1 tablet (10 mg total) by mouth daily. 03/30/19   [place], Velna Hatchet, MD  cyclobenzaprine (FLEXERIL) 5 MG tablet Take 1 tablet (5 mg total) by mouth 3 (three) times daily as needed for muscle spasms. 02/11/19   [place], Velna Hatchet, MD  fluticasone [facility]) 50 MCG/ACT nasal spray Place 2  sprays into both nostrils daily. 07/16/18   [place], Velna Hatchet, MD  INCASSIA 0.35 MG tablet Take 1 tablet by mouth daily. 04/04/20   [provider]  lisinopril-hydrochlorothiazide (ZESTORETIC) 20-25 MG tablet TAKE 1 TABLET BY MOUTH EVERY DAY 01/22/21   Cathlean Marseilles A, NP  metoprolol tartrate (LOPRESSOR) 50 MG tablet Take 1 tablet (50 mg total) by mouth 2 (two) times daily. 06/22/20   Valentino Nose, NP  montelukast (SINGULAIR) 10 MG tablet Take 1 tablet (10 mg total) by mouth at bedtime. 11/04/19   Salley Scarlet, MD  Multiple Vitamins-Minerals (HAIR VITAMINS) TABS 1 tablet daily Patient not taking: Reported on 12/20/2020 07/16/18   Salley Scarlet, MD  Prenatal Vit-Fe Fumarate-FA (PRENATAL VITAMINS) 28-0.8 MG TABS 1 tablet daily 07/16/18   Salley Scarlet, MD  traZODone (DESYREL) 50 MG tablet TAKE 0.5-1 TABLETS BY MOUTH AT BEDTIME AS NEEDED FOR SLEEP. 12/18/20   Valentino Nose, NP      Allergies    Pseudoephedrine, Norvasc [amlodipine besylate], and Amoxicillin    Review of Systems   Review of Systems  Respiratory:  Negative for shortness of breath.   Cardiovascular:  Negative for chest pain.  Gastrointestinal:  Positive for abdominal pain. Negative for nausea and vomiting.  Genitourinary:  Positive for vaginal bleeding.  Neurological:  Negative for dizziness and headaches.  Hematological:  Does not bruise/bleed easily.  All other systems reviewed  and are negative.  Physical Exam Updated Vital Signs BP (!) 159/96    Pulse 81    Temp 98.3 F (36.8 C) (Oral)    Resp 16    LMP 01/02/2021    SpO2 100%  Physical Exam Vitals and nursing note reviewed. Exam conducted with a chaperone present.  Constitutional:      Appearance: Normal appearance. She is not ill-appearing or toxic-appearing.  Cardiovascular:     Rate and Rhythm: Normal rate and regular rhythm.     Pulses: Normal pulses.  Pulmonary:     Effort: Pulmonary effort is normal.     Breath sounds: Normal  breath sounds.  Chest:     Chest wall: No tenderness.  Abdominal:     Palpations: Abdomen is soft.     Tenderness: There is no abdominal tenderness. There is no guarding.  Genitourinary:    Cervix: Cervical bleeding present. No cervical motion tenderness or friability.     Uterus: Not enlarged and not tender.      Adnexa:        Right: No mass or tenderness.         Left: No mass or tenderness.       Comments: Pelvic exam performed by me.  Small amount of blood in the vaginal vault.  Cervical os is closed.  No palpable adnexal masses or tenderness.  I do not appreciate any enlargement of the uterus Musculoskeletal:     Right lower leg: No edema.     Left lower leg: No edema.  Skin:    General: Skin is warm.     Capillary Refill: Capillary refill takes less than 2 seconds.  Neurological:     General: No focal deficit present.     Mental Status: She is alert.     Sensory: No sensory deficit.     Motor: No weakness.    ED Results / Procedures / Treatments   Labs (all labs ordered are listed, but only abnormal results are displayed) Labs Reviewed  WET PREP, GENITAL  URINALYSIS, ROUTINE W REFLEX MICROSCOPIC  CBC WITH DIFFERENTIAL/PLATELET  BASIC METABOLIC PANEL  I-STAT BETA HCG BLOOD, ED ([facility], [facility], [facility] ONLY)  GC/CHLAMYDIA PROBE AMP ([facility]) NOT AT [facility]    EKG None  Radiology No results found.  Procedures Procedures    Medications Ordered in ED Medications - No data to display  ED Course/ Medical Decision Making/ A&P                           Medical Decision Making Amount and/or Complexity of Data Reviewed Labs: ordered.   This patient presents to the ED for concern of lower abdominal pain and vaginal bleeding.  She is requesting pain control, was seen earlier this week by her OB/GYN and informed that she has a "blighted ovum"   The differential diagnosis includes ectopic pregnancy   Additional history obtained:  Prior medical records reviewed, no recent  notes from OB/GYN No available external records    Medicines ordered and prescription drug management:  I ordered medication including Percocet for pain   Patient here requesting pain control for stated "blighted ovum."  Pain has been uncontrolled with ibuprofen.  States that she had ultrasound performed on Tuesday.  No medical records of this are available for review.  She is well-appearing and nontoxic.  No significant tenderness on pelvic exam. Patient is hypertensive, history of same.  Has not taken her evening  dose of metoprolol.  Discussed findings with Jannifer Hickhris Groce, PA-C who assumes care and agrees to reevaluate patient after pain medication and review of her labs.  Patient will likely be discharged home.  I have low clinical suspicion for acute process or UTI.         Final Clinical Impression(s) / ED Diagnoses Final diagnoses:  None    Rx / DC Orders ED Discharge Orders     None         Rosey Bathriplett, Keiva Dina, PA-C 02/22/21 Tamala Ser1935    Miller, Brian, MD 02/23/21 2251

## 2021-02-22 NOTE — ED Triage Notes (Signed)
States she started vaginal bleeding 2 days ago and was given Motrin for discomfort. Advised by OB/gym at [facility] she was having an miscarriage, states she is having abdominal pain now

## 2021-02-22 NOTE — ED Notes (Signed)
Pt d/c home per MD order. Discharge summary reviewed, pt verbalizes understanding. Ambulatory off unit. No s/s of acute distress noted. Pt father is discharge ride home.

## 2021-02-25 LAB — GC/CHLAMYDIA PROBE AMP (~~LOC~~) NOT AT ARMC
Chlamydia: NEGATIVE
Comment: NEGATIVE
Comment: NORMAL
Neisseria Gonorrhea: NEGATIVE

## 2021-04-11 ENCOUNTER — Other Ambulatory Visit: Payer: Self-pay

## 2021-04-11 ENCOUNTER — Encounter: Payer: Self-pay | Admitting: Nurse Practitioner

## 2021-04-11 ENCOUNTER — Ambulatory Visit (HOSPITAL_COMMUNITY)
Admission: RE | Admit: 2021-04-11 | Discharge: 2021-04-11 | Disposition: A | Discharge status: Home / Self Care | Payer: 59 | Source: Ambulatory Visit | Attending: Nurse Practitioner | Admitting: Nurse Practitioner

## 2021-04-11 ENCOUNTER — Ambulatory Visit (INDEPENDENT_AMBULATORY_CARE_PROVIDER_SITE_OTHER): Payer: 59 | Admitting: Nurse Practitioner

## 2021-04-11 VITALS — BP 120/70 | HR 84 | Temp 97.7°F | Ht 62.0 in | Wt 158.0 lb

## 2021-04-11 DIAGNOSIS — F4323 Adjustment disorder with mixed anxiety and depressed mood: Secondary | ICD-10-CM

## 2021-04-11 DIAGNOSIS — M546 Pain in thoracic spine: Secondary | ICD-10-CM | POA: Insufficient documentation

## 2021-04-11 DIAGNOSIS — Z8639 Personal history of other endocrine, nutritional and metabolic disease: Secondary | ICD-10-CM

## 2021-04-11 DIAGNOSIS — I10 Essential (primary) hypertension: Secondary | ICD-10-CM

## 2021-04-11 DIAGNOSIS — E0590 Thyrotoxicosis, unspecified without thyrotoxic crisis or storm: Secondary | ICD-10-CM

## 2021-04-11 DIAGNOSIS — Z7689 Persons encountering health services in other specified circumstances: Secondary | ICD-10-CM

## 2021-04-11 DIAGNOSIS — J4540 Moderate persistent asthma, uncomplicated: Secondary | ICD-10-CM

## 2021-04-11 DIAGNOSIS — Z862 Personal history of diseases of the blood and blood-forming organs and certain disorders involving the immune mechanism: Secondary | ICD-10-CM

## 2021-04-11 MED ORDER — POTASSIUM CHLORIDE CRYS ER 20 MEQ PO TBCR: 20.0000 meq | EXTENDED_RELEASE_TABLET | Freq: Every day | ORAL | 2 refills | Status: DC

## 2021-04-11 MED ORDER — METOPROLOL TARTRATE 50 MG PO TABS: 50.0000 mg | ORAL_TABLET | Freq: Every day | ORAL | 2 refills | Status: DC

## 2021-04-11 MED ORDER — LISINOPRIL-HYDROCHLOROTHIAZIDE 20-25 MG PO TABS: 1.0000 | ORAL_TABLET | Freq: Every day | ORAL | 1 refills | Status: DC

## 2021-04-11 MED ORDER — FLUTICASONE FUROATE-VILANTEROL 100-25 MCG/ACT IN AEPB: 1.0000 | INHALATION_SPRAY | Freq: Every day | RESPIRATORY_TRACT | 11 refills | Status: DC

## 2021-04-11 MED ORDER — PREDNISONE 10 MG PO TABS: 10.0000 mg | ORAL_TABLET | Freq: Two times a day (BID) | ORAL | 0 refills | Status: AC

## 2021-04-11 MED ORDER — ALBUTEROL SULFATE HFA 108 (90 BASE) MCG/ACT IN AERS: INHALATION_SPRAY | RESPIRATORY_TRACT | 11 refills | Status: DC

## 2021-04-11 NOTE — Progress Notes (Addendum)
? ?Subjective:  ? ? Patient ID: Diana [name], female    DOB: 15-Sep-1985, 36 y.o.   MRN: [id number] ? ?HPI ? ?36 year old patient with history of hypertension asthma, subclinical hyperthyroidism, anxiety and depression here to establish care. ? ?Patient also has complaint of upper back pain that started about 3 weeks ago.  Patient states that she has had a similar pain in the past which she was prescribed a muscle relaxer.  Patient describes pain as a sharp pain in between her shoulder blades that is better when lying down and resting but worse when up and moving around such as walking.  Patient states that pain at occasionally radiates to her left shoulder and that she will sometimes experience numbness to her left arm.  Patient denies any weakness or tingling to her left arm or shoulder.  Patient also states that she believes her pain might be related to her asthma exacerbations.   ? ?Patient also has concerns about her metoprolol.  Patient states that she was placed on metoprolol for palpitations.  Patient takes 50 mg twice daily.  Patient thinks that medication might be too high because she feels extremely tired in the morning and sluggish like somebody is holding her in bed.  Patient denies any lightheadedness, dizziness, headaches.   ? ?Patient states that she also is here for asthma refills.  Patient uses albuterol, Breo Ellipta, Flonase, Singulair, and Zyrtec for asthma.  Patient states that over the past 3 weeks she has had to use her albuterol inhaler every day due to her upper back pain and wheezing.  Patient states that her upper back pain is sometimes relieved with albuterol use.  Patient denies any difficulty breathing, chest pain. ? ?Patient admits that she has felt depressed lately after having a miscarriage approximately 2 months ago.  Patient was [redacted] weeks gestation at time of miscarriage.  Patient states that she would like to be referred to psychiatry for counseling.  Patient denies any thoughts of  hurting herself or anyone else. ? ? ?Review of Systems  ?Constitutional:  Positive for fatigue.  ?Respiratory:  Positive for wheezing.   ?Musculoskeletal:  Positive for back pain.  ?Neurological:  Positive for numbness.  ? ?   ?Objective:  ? Physical Exam ?Vitals reviewed.  ?Constitutional:   ?   General: She is not in acute distress. ?   Appearance: Normal appearance. She is normal weight. She is not ill-appearing or toxic-appearing.  ?Cardiovascular:  ?   Rate and Rhythm: Normal rate and regular rhythm.  ?   Pulses: Normal pulses.  ?   Heart sounds: Normal heart sounds. No murmur heard. ?Pulmonary:  ?   Effort: Pulmonary effort is normal. No respiratory distress.  ?   Breath sounds: Normal breath sounds. No stridor. No wheezing, rhonchi or rales.  ?Chest:  ?   Chest wall: No tenderness.  ?Musculoskeletal:     ?   General: Normal range of motion.  ?   Cervical back: Normal range of motion and neck supple. No rigidity or tenderness.  ?Lymphadenopathy:  ?   Cervical: No cervical adenopathy.  ?Skin: ?   General: Skin is warm.  ?   Capillary Refill: Capillary refill takes less than 2 seconds.  ?Neurological:  ?   General: No focal deficit present.  ?   Mental Status: She is alert and oriented to person, place, and time.  ?Psychiatric:     ?   Mood and Affect: Mood normal.     ?  Behavior: Behavior normal.  ? ? ? ? ? ?   ?Assessment & Plan:  ? ?1. Encounter to establish care ?-Patient to return in 3 weeks for follow-up of metoprolol, back pain, and asthma. ? ?2. Essential hypertension ?-Blood pressure today well controlled at 120/70.  Goal blood pressure 140/90 met. ?-We will decrease metoprolol to 50 mg daily instead of twice daily. ?- Continue to take Zestoretic as prescribed. ?-Last potassium level was 3.6.  We will recheck potassium level. ?-Patient to start on 20 mEq of potassium daily.  We will contact patient if potassium needs to be adjusted. ?- lisinopril-hydrochlorothiazide (ZESTORETIC) 20-25 MG tablet; Take  1 tablet by mouth daily.  Dispense: 90 tablet; Refill: 1 ?- metoprolol tartrate (LOPRESSOR) 50 MG tablet; Take 1 tablet (50 mg total) by mouth daily.  Dispense: 30 tablet; Refill: 2 ?- Lipid panel ?- CMP14+EGFR ?- potassium chloride SA (KLOR-CON M) 20 MEQ tablet; Take 1 tablet (20 mEq total) by mouth daily.  Dispense: 30 tablet; Refill: 2 ?-Return to clinic in 3 months for follow-up. ? ?3. History of anemia ?-We will assess for presence of anemia. ?- CBC with Differential/Platelet ? ?4. History of vitamin D deficiency ?-We will assess vitamin D deficiency. ?- Vitamin D (25 hydroxy) ? ?5. Subclinical hyperthyroidism ?-We will assess thyroid level. ?- TSH + free T4 ? ?6. Moderate persistent asthma, unspecified whether complicated ?-Back pain may be related to asthma exacerbation. ?-Patient encouraged to use Breo Ellipta as prescribed ?-And prednisone burst. ?- albuterol (VENTOLIN HFA) 108 (90 Base) MCG/ACT inhaler; INHALE 2 PUFFS INTO THE LUNGS EVERY 4 HOURS AS NEEDED FOR WHEEZE OR FOR SHORTNESS OF BREATH  Dispense: 18 g; Refill: 11 ?- fluticasone furoate-vilanterol (BREO ELLIPTA) 100-25 MCG/ACT AEPB; Inhale 1 puff into the lungs daily.  Dispense: 1 each; Refill: 11 ?- predniSONE (DELTASONE) 10 MG tablet; Take 1 tablet (10 mg total) by mouth 2 (two) times daily with a meal for 5 days.  Dispense: 10 tablet; Refill: 0 ?-Continue to take maintenance asthma medications as prescribed. ?-Follow-up in 3 months ?-Return to clinic if possible asthma symptoms not better ? ?7. Acute midline thoracic back pain ?-We will assess [place] skeletal etiologies of back pain with x-ray ?- DG Cervical Spine Complete ?- DG Thoracic Spine W/Swimmers ?-We will follow-up in 3 months or sooner depending on results of x-ray. ? ?8. Situational mixed anxiety and depressive disorder ?-Offered medication such as SSRI however patient states that she would rather start with counseling first. ?- Ambulatory referral to Psychiatry ?-PHQ-9 13 today ?-GAD-7  10 ?-Follow-up in 3 months ? ?

## 2021-04-12 ENCOUNTER — Other Ambulatory Visit: Payer: Self-pay | Admitting: Nurse Practitioner

## 2021-04-12 DIAGNOSIS — E559 Vitamin D deficiency, unspecified: Secondary | ICD-10-CM

## 2021-04-12 DIAGNOSIS — R748 Abnormal levels of other serum enzymes: Secondary | ICD-10-CM

## 2021-04-12 DIAGNOSIS — D649 Anemia, unspecified: Secondary | ICD-10-CM

## 2021-04-12 LAB — CMP14+EGFR
ALT: 64 IU/L — ABNORMAL HIGH (ref 0–32)
AST: 29 IU/L (ref 0–40)
Albumin/Globulin Ratio: 1.7 (ref 1.2–2.2)
Albumin: 4.5 g/dL (ref 3.8–4.8)
Alkaline Phosphatase: 83 IU/L (ref 44–121)
BUN/Creatinine Ratio: 16 (ref 9–23)
BUN: 13 mg/dL (ref 6–20)
Bilirubin Total: 0.2 mg/dL (ref 0.0–1.2)
CO2: 24 mmol/L (ref 20–29)
Calcium: 9.5 mg/dL (ref 8.7–10.2)
Chloride: 100 mmol/L (ref 96–106)
Creatinine, Ser: 0.82 mg/dL (ref 0.57–1.00)
Globulin, Total: 2.6 g/dL (ref 1.5–4.5)
Glucose: 89 mg/dL (ref 70–99)
Potassium: 3.8 mmol/L (ref 3.5–5.2)
Sodium: 138 mmol/L (ref 134–144)
Total Protein: 7.1 g/dL (ref 6.0–8.5)
eGFR: 95 mL/min/{1.73_m2} (ref >59)

## 2021-04-12 LAB — TSH+FREE T4
Free T4: 1.55 ng/dL (ref 0.82–1.77)
TSH: 0.815 u[IU]/mL (ref 0.450–4.500)

## 2021-04-12 LAB — LIPID PANEL
Chol/HDL Ratio: 3.4 ratio (ref 0.0–4.4)
Cholesterol, Total: 185 mg/dL (ref 100–199)
HDL: 55 mg/dL (ref >39)
LDL Chol Calc (NIH): 113 mg/dL — ABNORMAL HIGH (ref 0–99)
Triglycerides: 96 mg/dL (ref 0–149)
VLDL Cholesterol Cal: 17 mg/dL (ref 5–40)

## 2021-04-12 LAB — CBC WITH DIFFERENTIAL/PLATELET
Basophils Absolute: 0.1 10*3/uL (ref 0.0–0.2)
Basos: 1 %
EOS (ABSOLUTE): 0.1 10*3/uL (ref 0.0–0.4)
Eos: 2 %
Hematocrit: 32 % — ABNORMAL LOW (ref 34.0–46.6)
Hemoglobin: 9.8 g/dL — ABNORMAL LOW (ref 11.1–15.9)
Immature Grans (Abs): 0 10*3/uL (ref 0.0–0.1)
Immature Granulocytes: 0 %
Lymphocytes Absolute: 2.8 10*3/uL (ref 0.7–3.1)
Lymphs: 35 %
MCH: 23 pg — ABNORMAL LOW (ref 26.6–33.0)
MCHC: 30.6 g/dL — ABNORMAL LOW (ref 31.5–35.7)
MCV: 75 fL — ABNORMAL LOW (ref 79–97)
Monocytes Absolute: 0.6 10*3/uL (ref 0.1–0.9)
Monocytes: 7 %
Neutrophils Absolute: 4.5 10*3/uL (ref 1.4–7.0)
Neutrophils: 55 %
Platelets: 541 10*3/uL — ABNORMAL HIGH (ref 150–450)
RBC: 4.26 x10E6/uL (ref 3.77–5.28)
RDW: 14.6 % (ref 11.7–15.4)
WBC: 8.1 10*3/uL (ref 3.4–10.8)

## 2021-04-12 LAB — VITAMIN D 25 HYDROXY (VIT D DEFICIENCY, FRACTURES): Vit D, 25-Hydroxy: 22.2 ng/mL — ABNORMAL LOW (ref 30.0–100.0)

## 2021-04-12 MED ORDER — VITAMIN D3 25 MCG (1000 UT) PO CAPS: 1000.0000 [IU] | ORAL_CAPSULE | Freq: Every day | ORAL | 0 refills | Status: DC

## 2021-04-30 ENCOUNTER — Other Ambulatory Visit: Payer: Self-pay | Admitting: Nurse Practitioner

## 2021-05-02 ENCOUNTER — Other Ambulatory Visit: Payer: Self-pay | Admitting: Nurse Practitioner

## 2021-05-02 ENCOUNTER — Ambulatory Visit (INDEPENDENT_AMBULATORY_CARE_PROVIDER_SITE_OTHER): Payer: 59 | Admitting: Nurse Practitioner

## 2021-05-02 ENCOUNTER — Encounter: Payer: Self-pay | Admitting: Nurse Practitioner

## 2021-05-02 VITALS — BP 130/98 | HR 69 | Temp 98.1°F | Ht 62.0 in | Wt 159.0 lb

## 2021-05-02 DIAGNOSIS — N939 Abnormal uterine and vaginal bleeding, unspecified: Secondary | ICD-10-CM | POA: Diagnosis not present

## 2021-05-02 DIAGNOSIS — J4550 Severe persistent asthma, uncomplicated: Secondary | ICD-10-CM

## 2021-05-02 DIAGNOSIS — I10 Essential (primary) hypertension: Secondary | ICD-10-CM | POA: Diagnosis not present

## 2021-05-02 DIAGNOSIS — E559 Vitamin D deficiency, unspecified: Secondary | ICD-10-CM

## 2021-05-02 DIAGNOSIS — D509 Iron deficiency anemia, unspecified: Secondary | ICD-10-CM | POA: Diagnosis not present

## 2021-05-02 MED ORDER — VITAMIN D3 50 MCG (2000 UT) PO CAPS
2000.0000 [IU] | ORAL_CAPSULE | Freq: Every day | ORAL | 1 refills | Status: DC
Start: 2021-05-02 — End: 2021-05-28

## 2021-05-02 MED ORDER — IRON (FERROUS SULFATE) 325 (65 FE) MG PO TABS: 325.0000 mg | ORAL_TABLET | Freq: Every day | ORAL | 1 refills | Status: DC

## 2021-05-02 MED ORDER — AMLODIPINE BESYLATE 2.5 MG PO TABS: 2.5000 mg | ORAL_TABLET | Freq: Every day | ORAL | 0 refills | Status: DC

## 2021-05-02 MED ORDER — FEXOFENADINE HCL 60 MG PO TABS: 60.0000 mg | ORAL_TABLET | Freq: Two times a day (BID) | ORAL | 2 refills | Status: DC

## 2021-05-02 NOTE — Progress Notes (Signed)
? ?Subjective:  ? ? Patient ID: Diana [name], female    DOB: 01/22/1986, 36 y.o.   MRN: [id number] ? ?HPI ? ?36 year old female with history of hypertension, asthma, anemia, and recent spontaneous abortion/vaginal bleeding presents to clinic for 3-week follow-up of lab results.   ? ?Anemia ?Iron studies and CBC reviewed with patient.  Patient notified that she is anemic and her iron is low.  Patient states that she does sometimes feel cold and she is oftentimes fatigued.  Patient denies any lightheadedness, dizziness, difficulty breathing. ? ?Back Pain ?Patient states that her back pain is greatly improved.  Patient states that the pain is no longer there.   ? ?Asthma ?Patient does state that she has had increasing shortness of breath and wheezing where she has to use her albuterol inhaler twice a day every day.  Patient denies any nighttime asthma symptoms.  Patient states that she continues to use her Breo Ellipta, Flonase, Zyrtec, Singulair however continues to have symptoms.  Patient states that her asthma will oftentimes exacerbate during spring. ? ?Vaginal Bleeding ?Patient states that after her spontaneous abortion she was placed on Micronor.  Patient states that she has very light menstrual flow however continues to have breakthrough bleeding with Micronor.  On Saturday (approximately 5 days ago), patient states that she noticed that she had intense cramping in which she had vaginal bleeding which is since stopped.  Patient denies any lightheadedness, loss of consciousness, difficulty breathing, current abdominal cramping, current vaginal bleeding. ? ?Blood Pressure ?Patient states that she has noticed that her blood pressure has been up over the past couple months.  Patient believes that is possibly the reason why she was started on Micronor and not a combined oral contraceptive.  Patient denies any chest pain, headaches, swelling, difficulty breathing ?. ? ?Review of Systems  ?Constitutional:  Positive for  fatigue.  ?Respiratory:  Positive for wheezing.   ?Endocrine: Positive for cold intolerance.  ? ?   ?Objective:  ? Physical Exam ?Vitals reviewed.  ?Constitutional:   ?   General: She is not in acute distress. ?   Appearance: Normal appearance. She is normal weight. She is not ill-appearing, toxic-appearing or diaphoretic.  ?Cardiovascular:  ?   Rate and Rhythm: Normal rate and regular rhythm.  ?   Pulses: Normal pulses.  ?   Heart sounds: Normal heart sounds. No murmur heard. ?Pulmonary:  ?   Effort: Pulmonary effort is normal. No respiratory distress.  ?   Breath sounds: Normal breath sounds. No stridor. No wheezing, rhonchi or rales.  ?Musculoskeletal:  ?   Cervical back: Normal range of motion and neck supple. No rigidity or tenderness.  ?   Comments: Grossly intact  ?Lymphadenopathy:  ?   Cervical: No cervical adenopathy.  ?Skin: ?   General: Skin is warm.  ?   Capillary Refill: Capillary refill takes less than 2 seconds.  ?Neurological:  ?   Mental Status: She is alert.  ?   Comments: Grossly intact  ?Psychiatric:     ?   Mood and Affect: Mood normal.     ?   Behavior: Behavior normal.  ? ? ? ? ? ?   ?Assessment & Plan:  ?1. Iron deficiency anemia, unspecified iron deficiency anemia type ?-Last hemoglobin was 9.8.  MCV 73.  Iron saturation 6.  Ferritin 11.  Total iron binding capacity 452. ?-We will start patient on iron and recheck CBC and iron study in 4 weeks. ?-MCV noticeably low prolonged.  Of  time.  Patient might have alpha thalassemia which is also confounding results. ?-We will obtain reticulocyte to determine if hematology referral necessary. ?-We will also do transvaginal ultrasound to determine if a source of bleeding can be identified.  ?-If transvaginal ultrasound not conclusive will consider FOBT ?- Iron, Ferrous Sulfate, 325 (65 Fe) MG TABS; Take 325 mg by mouth daily.  Dispense: 90 tablet; Refill: 1 ?- US Pelvic Complete With Transvaginal ?- CBC with Differential ?- Reticulocytes ?-Return to  clinic 2 weeks ? ?2. Severe persistent asthma, unspecified whether complicated ?-Due to patient's use of albuterol every day multiple times a day patient is currently identified as severe persistent asthma. ?-We will start patient on another allergy medication and refer patient to allergy asthma. ?-Prednisone 3m BID x5 days also prescribed.  ?- Ambulatory referral to Allergy ?- fexofenadine (ALLEGRA ALLERGY) 60 MG tablet; Take 1 tablet (60 mg total) by mouth 2 (two) times daily.  Dispense: 60 tablet; Refill: 2 ?- Take allegra after taking prednisone if needed.  ?-Return to clinic in 2 weeks ? ?3. Vaginal bleeding ?- [place]vic Complete With Transvaginal ?- CBC with Differential ?-Return to clinic in 2 weeks ? ?4. Essential hypertension ?-Blood pressure initially 150/100.  Upon recheck blood pressure was 130/98. ?-We will add amlodipine 2.5 mg. ?-Patient already taking lisinopril 20 mg and hydrochlorothiazide 25 mg (Zestoretic) ?-Hydrochlorothiazide is already maxed out and I am reluctant to increase lisinopril to 40 mg because of increased risk of side effects. ?-We will evaluate how patient does with additional amlodipine 2.528m  If blood pressure still not adequately controlled with this combination we will consider stopping Zestoretic and starting valsartan. ?- amLODipine (NORVASC) 2.5 MG tablet; Take 1 tablet (2.5 mg total) by mouth daily.  Dispense: 30 tablet; Refill: 0 ?- CMP14+EGFR ?- Urine Microalbumin w/creat. Ratio ?-Return to clinic in 2 weeks ? ?5. Vitamin D deficiency ?-Vitamin D increased from 22.2 to 23.0 in the 2-week span. ?-Vitamin D increased from 1000 units to 2000 units daily. ?- Cholecalciferol (VITAMIN D3) 50 MCG (2000 UT) capsule; Take 1 capsule (2,000 Units total) by mouth daily.  Dispense: 30 capsule; Refill: 1 ?-Follow-up in 2 weeks ? ?  ?Note:  This document was prepared using Dragon voice recognition software and may include unintentional dictation errors. ? ? ? ?

## 2021-05-03 ENCOUNTER — Encounter: Payer: Self-pay | Admitting: Nurse Practitioner

## 2021-05-03 LAB — IRON,TIBC AND FERRITIN PANEL
Ferritin: 11 ng/mL — ABNORMAL LOW (ref 15–150)
Iron Saturation: 6 % — CL (ref 15–55)
Iron: 28 ug/dL (ref 27–159)
Total Iron Binding Capacity: 452 ug/dL — ABNORMAL HIGH (ref 250–450)
UIBC: 424 ug/dL (ref 131–425)

## 2021-05-03 LAB — CMP14+EGFR
ALT: 82 IU/L — ABNORMAL HIGH (ref 0–32)
ALT: 114 IU/L — ABNORMAL HIGH (ref 0–32)
AST: 31 IU/L (ref 0–40)
AST: 29 IU/L (ref 0–40)
Albumin/Globulin Ratio: 1.5 (ref 1.2–2.2)
Albumin/Globulin Ratio: 1.7 (ref 1.2–2.2)
Albumin: 4.2 g/dL (ref 3.8–4.8)
Albumin: 4.3 g/dL (ref 3.8–4.8)
Alkaline Phosphatase: 68 IU/L (ref 44–121)
Alkaline Phosphatase: 69 IU/L (ref 44–121)
BUN/Creatinine Ratio: 19 (ref 9–23)
BUN/Creatinine Ratio: 19 (ref 9–23)
BUN: 15 mg/dL (ref 6–20)
BUN: 14 mg/dL (ref 6–20)
Bilirubin Total: <0.2 mg/dL (ref 0.0–1.2)
Bilirubin Total: <0.2 mg/dL (ref 0.0–1.2)
CO2: 23 mmol/L (ref 20–29)
CO2: 24 mmol/L (ref 20–29)
Calcium: 9.7 mg/dL (ref 8.7–10.2)
Calcium: 9.5 mg/dL (ref 8.7–10.2)
Chloride: 101 mmol/L (ref 96–106)
Chloride: 101 mmol/L (ref 96–106)
Creatinine, Ser: 0.8 mg/dL (ref 0.57–1.00)
Creatinine, Ser: 0.72 mg/dL (ref 0.57–1.00)
Globulin, Total: 2.8 g/dL (ref 1.5–4.5)
Globulin, Total: 2.6 g/dL (ref 1.5–4.5)
Glucose: 94 mg/dL (ref 70–99)
Glucose: 105 mg/dL — ABNORMAL HIGH (ref 70–99)
Potassium: 3.5 mmol/L (ref 3.5–5.2)
Potassium: 3.5 mmol/L (ref 3.5–5.2)
Sodium: 141 mmol/L (ref 134–144)
Sodium: 139 mmol/L (ref 134–144)
Total Protein: 7 g/dL (ref 6.0–8.5)
Total Protein: 6.9 g/dL (ref 6.0–8.5)
eGFR: 98 mL/min/{1.73_m2} (ref >59)
eGFR: 111 mL/min/{1.73_m2} (ref >59)

## 2021-05-03 LAB — CBC WITH DIFFERENTIAL/PLATELET
Basophils Absolute: 0.1 10*3/uL (ref 0.0–0.2)
Basophils Absolute: 0.1 10*3/uL (ref 0.0–0.2)
Basos: 1 %
Basos: 1 %
EOS (ABSOLUTE): 0 10*3/uL (ref 0.0–0.4)
EOS (ABSOLUTE): 0.1 10*3/uL (ref 0.0–0.4)
Eos: 0 %
Eos: 1 %
Hematocrit: 30.2 % — ABNORMAL LOW (ref 34.0–46.6)
Hematocrit: 32.2 % — ABNORMAL LOW (ref 34.0–46.6)
Hemoglobin: 9.8 g/dL — ABNORMAL LOW (ref 11.1–15.9)
Hemoglobin: 10.1 g/dL — ABNORMAL LOW (ref 11.1–15.9)
Immature Grans (Abs): 0 10*3/uL (ref 0.0–0.1)
Immature Grans (Abs): 0 10*3/uL (ref 0.0–0.1)
Immature Granulocytes: 0 %
Immature Granulocytes: 0 %
Lymphocytes Absolute: 2.9 10*3/uL (ref 0.7–3.1)
Lymphocytes Absolute: 3.7 10*3/uL — ABNORMAL HIGH (ref 0.7–3.1)
Lymphs: 19 %
Lymphs: 31 %
MCH: 23.7 pg — ABNORMAL LOW (ref 26.6–33.0)
MCH: 22.7 pg — ABNORMAL LOW (ref 26.6–33.0)
MCHC: 32.5 g/dL (ref 31.5–35.7)
MCHC: 31.4 g/dL — ABNORMAL LOW (ref 31.5–35.7)
MCV: 73 fL — ABNORMAL LOW (ref 79–97)
MCV: 73 fL — ABNORMAL LOW (ref 79–97)
Monocytes Absolute: 1.3 10*3/uL — ABNORMAL HIGH (ref 0.1–0.9)
Monocytes Absolute: 0.8 10*3/uL (ref 0.1–0.9)
Monocytes: 8 %
Monocytes: 6 %
Neutrophils Absolute: 10.9 10*3/uL — ABNORMAL HIGH (ref 1.4–7.0)
Neutrophils Absolute: 7.3 10*3/uL — ABNORMAL HIGH (ref 1.4–7.0)
Neutrophils: 72 %
Neutrophils: 61 %
Platelets: 483 10*3/uL — ABNORMAL HIGH (ref 150–450)
Platelets: 500 10*3/uL — ABNORMAL HIGH (ref 150–450)
RBC: 4.13 x10E6/uL (ref 3.77–5.28)
RBC: 4.44 x10E6/uL (ref 3.77–5.28)
RDW: 14.6 % (ref 11.7–15.4)
RDW: 14.3 % (ref 11.7–15.4)
WBC: 15.2 10*3/uL — ABNORMAL HIGH (ref 3.4–10.8)
WBC: 12 10*3/uL — ABNORMAL HIGH (ref 3.4–10.8)

## 2021-05-03 LAB — MICROALBUMIN / CREATININE URINE RATIO
Creatinine, Urine: 80.6 mg/dL
Microalb/Creat Ratio: <4 mg/g creat (ref 0–29)
Microalbumin, Urine: <3 ug/mL

## 2021-05-03 LAB — VITAMIN D 25 HYDROXY (VIT D DEFICIENCY, FRACTURES): Vit D, 25-Hydroxy: 23 ng/mL — ABNORMAL LOW (ref 30.0–100.0)

## 2021-05-03 MED ORDER — PREDNISONE 20 MG PO TABS: 20.0000 mg | ORAL_TABLET | Freq: Two times a day (BID) | ORAL | 0 refills | Status: AC

## 2021-05-07 ENCOUNTER — Other Ambulatory Visit: Payer: Self-pay | Admitting: Nurse Practitioner

## 2021-05-07 DIAGNOSIS — E559 Vitamin D deficiency, unspecified: Secondary | ICD-10-CM

## 2021-05-16 ENCOUNTER — Encounter: Payer: Self-pay | Admitting: Nurse Practitioner

## 2021-05-16 ENCOUNTER — Ambulatory Visit (INDEPENDENT_AMBULATORY_CARE_PROVIDER_SITE_OTHER): Payer: 59 | Admitting: Nurse Practitioner

## 2021-05-16 VITALS — BP 121/78 | HR 73 | Temp 97.9°F | Ht 62.0 in | Wt 156.4 lb

## 2021-05-16 DIAGNOSIS — E559 Vitamin D deficiency, unspecified: Secondary | ICD-10-CM | POA: Diagnosis not present

## 2021-05-16 DIAGNOSIS — D509 Iron deficiency anemia, unspecified: Secondary | ICD-10-CM | POA: Diagnosis not present

## 2021-05-16 DIAGNOSIS — J4550 Severe persistent asthma, uncomplicated: Secondary | ICD-10-CM

## 2021-05-16 DIAGNOSIS — I10 Essential (primary) hypertension: Secondary | ICD-10-CM

## 2021-05-16 MED ORDER — AMLODIPINE BESYLATE 2.5 MG PO TABS: 2.5000 mg | ORAL_TABLET | Freq: Every day | ORAL | 1 refills | Status: DC

## 2021-05-16 NOTE — Progress Notes (Signed)
9 ? ?Subjective:  ? ? Patient ID: Diana [name], female    DOB: [birth year], 36 y.o.   MRN: [id number] ? ?HPI ? ?36 year old female patient with history of uncontrolled blood pressure, iron deficiency anemia, asthma, and spontaneous abortion presents to clinic for follow-up of anemia and blood pressure.   ? ?Iron deficiency anemia ?Patient states overall she has been feeling better.  Patient denies any palpitations, blood in stools, melena, blood blisters, dizziness, lightheadedness, shortness of breath. ? ?Patient has not had a chance to schedule vaginal ultrasound due to work schedule.  However she states that she will get it done soon.  Patient does report having 1 episode of heavy vaginal bleeding that patient believes was her menstrual cycle.  Patient states that she noted heavy menstrual bleeding for 5 to 6 days last week which is since stopped. ? ?Patient does admit to having occasional leg cramps over the last couple weeks.  Patient denies any numbness or tingling to her legs. ? ?Hypertension  ?Patient was started on amlodipine 2.5 mg during last visit.  Patient states that she is tolerating medication well.  Blood pressure during today's visit is 121/78.  Patient denies any signs or symptoms of hypotension.  Patient denies any swelling of her legs or other areas.  Patient also denies any itchiness. ? ?Asthma ?Patient states that she is scheduled to see the asthma specialist. ?Patient states that her inhaler use is down from using her inhaler every day to now only twice a week.  Patient states that prednisone burst did help to calm down her symptoms.  Patient states that she has been using the [place] but is not sure if it is making a difference.  Patient states that currently she is doing well but still looks forward to meeting with the asthma specialist. ? ?Vitamin D deficiency ?Patient still taking vitamin D supplements as prescribed without difficulty. ? ?Review of Systems  ?Musculoskeletal:   ?      Occasional leg cramps.  ? ?   ?Objective:  ? Physical Exam ?Vitals reviewed.  ?Constitutional:   ?   General: She is not in acute distress. ?   Appearance: Normal appearance. She is normal weight. She is not ill-appearing, toxic-appearing or diaphoretic.  ?HENT:  ?   Head: Normocephalic and atraumatic.  ?Eyes:  ?   General: Scleral icterus: .bdragon.  ?Cardiovascular:  ?   Rate and Rhythm: Normal rate and regular rhythm.  ?   Pulses: Normal pulses.  ?   Heart sounds: Normal heart sounds. No murmur heard. ?Pulmonary:  ?   Effort: Pulmonary effort is normal. No respiratory distress.  ?   Breath sounds: Normal breath sounds. No wheezing.  ?Musculoskeletal:     ?   General: No swelling.  ?   Right lower leg: No edema.  ?   Left lower leg: No edema.  ?   Comments: Grossly intact  ?Skin: ?   General: Skin is warm.  ?   Capillary Refill: Capillary refill takes less than 2 seconds.  ?Neurological:  ?   Mental Status: She is alert.  ?   Comments: Grossly intact  ?Psychiatric:     ?   Mood and Affect: Mood normal.     ?   Behavior: Behavior normal.  ? ? ?   ?Assessment & Plan:  ? ?1. Iron deficiency anemia, unspecified iron deficiency anemia type ?-Vaginal ultrasound pending. ?-If results of vaginal ultrasound are benign and if hemoglobin not within normal range will  consider fecal Hemoccult test. ?- CBC with Differential ?-We will order iron studies to be done in 4 weeks. ?-Continue to take iron pills as prescribed ?-As long as current labs continue to look well we will see patient back in 3 months. ? ?2. Essential hypertension ?-Blood pressure today 121/78.  Goal of blood pressure less than 140/90 met. ?-Continue to take amlodipine 2.5 mg daily as prescribed. ?-Continue to take Zestoretic as prescribed and potassium as prescribed. ?- CMP14+EGFR ?-Follow-up in 3 months ? ?3. Severe persistent asthma, unspecified whether complicated ?-Patient to follow-up with asthma specialist. ?-Continue to take albuterol as  prescribed ?-Continue to take Flonase, Breo Ellipta, Allegra, Zyrtec, Singulair as prescribed for asthma symptoms. ?-Follow-up in 3 months ? ?4. Vitamin D deficiency ?- Vitamin D (25 hydroxy) ? ? ? ?

## 2021-05-17 LAB — SPECIMEN STATUS REPORT

## 2021-05-18 LAB — CBC WITH DIFFERENTIAL/PLATELET
Basophils Absolute: 0.1 10*3/uL (ref 0.0–0.2)
Basos: 1 %
EOS (ABSOLUTE): 0.1 10*3/uL (ref 0.0–0.4)
Eos: 1 %
Hematocrit: 34.4 % (ref 34.0–46.6)
Hemoglobin: 10.9 g/dL — ABNORMAL LOW (ref 11.1–15.9)
Immature Grans (Abs): 0 10*3/uL (ref 0.0–0.1)
Immature Granulocytes: 0 %
Lymphocytes Absolute: 3.2 10*3/uL — ABNORMAL HIGH (ref 0.7–3.1)
Lymphs: 32 %
MCH: 23.3 pg — ABNORMAL LOW (ref 26.6–33.0)
MCHC: 31.7 g/dL (ref 31.5–35.7)
MCV: 74 fL — ABNORMAL LOW (ref 79–97)
Monocytes Absolute: 0.7 10*3/uL (ref 0.1–0.9)
Monocytes: 7 %
Neutrophils Absolute: 6 10*3/uL (ref 1.4–7.0)
Neutrophils: 59 %
Platelets: 561 10*3/uL — ABNORMAL HIGH (ref 150–450)
RBC: 4.68 x10E6/uL (ref 3.77–5.28)
RDW: 15.9 % — ABNORMAL HIGH (ref 11.7–15.4)
WBC: 10 10*3/uL (ref 3.4–10.8)

## 2021-05-18 LAB — CMP14+EGFR
ALT: 18 IU/L (ref 0–32)
AST: 14 IU/L (ref 0–40)
Albumin/Globulin Ratio: 1.6 (ref 1.2–2.2)
Albumin: 4.7 g/dL (ref 3.8–4.8)
Alkaline Phosphatase: 86 IU/L (ref 44–121)
BUN/Creatinine Ratio: 20 (ref 9–23)
BUN: 15 mg/dL (ref 6–20)
Bilirubin Total: 0.2 mg/dL (ref 0.0–1.2)
CO2: 23 mmol/L (ref 20–29)
Calcium: 10 mg/dL (ref 8.7–10.2)
Chloride: 105 mmol/L (ref 96–106)
Creatinine, Ser: 0.75 mg/dL (ref 0.57–1.00)
Globulin, Total: 2.9 g/dL (ref 1.5–4.5)
Glucose: 91 mg/dL (ref 70–99)
Potassium: 4.3 mmol/L (ref 3.5–5.2)
Sodium: 144 mmol/L (ref 134–144)
Total Protein: 7.6 g/dL (ref 6.0–8.5)
eGFR: 106 mL/min/{1.73_m2} (ref >59)

## 2021-05-18 LAB — VITAMIN D 25 HYDROXY (VIT D DEFICIENCY, FRACTURES): Vit D, 25-Hydroxy: 34.4 ng/mL (ref 30.0–100.0)

## 2021-05-20 ENCOUNTER — Other Ambulatory Visit: Payer: Self-pay | Admitting: Nurse Practitioner

## 2021-05-20 DIAGNOSIS — E559 Vitamin D deficiency, unspecified: Secondary | ICD-10-CM

## 2021-05-24 ENCOUNTER — Other Ambulatory Visit: Payer: Self-pay | Admitting: Nurse Practitioner

## 2021-05-24 DIAGNOSIS — E559 Vitamin D deficiency, unspecified: Secondary | ICD-10-CM

## 2021-05-25 LAB — SPECIMEN STATUS REPORT

## 2021-05-25 LAB — RETICULOCYTES: Retic Ct Pct: 1.7 % (ref 0.6–2.6)

## 2021-05-27 ENCOUNTER — Ambulatory Visit (HOSPITAL_COMMUNITY): Admission: RE | Admit: 2021-05-27 | Payer: 59 | Source: Ambulatory Visit

## 2021-06-12 ENCOUNTER — Other Ambulatory Visit: Payer: Self-pay | Admitting: Nurse Practitioner

## 2021-06-12 DIAGNOSIS — E559 Vitamin D deficiency, unspecified: Secondary | ICD-10-CM

## 2021-06-17 ENCOUNTER — Encounter: Payer: Self-pay | Admitting: Allergy & Immunology

## 2021-06-17 ENCOUNTER — Other Ambulatory Visit: Payer: Self-pay | Admitting: Allergy & Immunology

## 2021-06-17 ENCOUNTER — Ambulatory Visit (INDEPENDENT_AMBULATORY_CARE_PROVIDER_SITE_OTHER): Payer: 59 | Admitting: Allergy & Immunology

## 2021-06-17 ENCOUNTER — Other Ambulatory Visit: Payer: Self-pay | Admitting: Nurse Practitioner

## 2021-06-17 ENCOUNTER — Other Ambulatory Visit: Payer: Self-pay

## 2021-06-17 ENCOUNTER — Encounter: Payer: Self-pay | Admitting: Nurse Practitioner

## 2021-06-17 VITALS — BP 142/88 | HR 97 | Temp 98.4°F | Resp 18 | Ht 60.0 in | Wt 160.0 lb

## 2021-06-17 DIAGNOSIS — J310 Chronic rhinitis: Secondary | ICD-10-CM | POA: Diagnosis not present

## 2021-06-17 DIAGNOSIS — J4530 Mild persistent asthma, uncomplicated: Secondary | ICD-10-CM | POA: Diagnosis not present

## 2021-06-17 DIAGNOSIS — T7800XD Anaphylactic reaction due to unspecified food, subsequent encounter: Secondary | ICD-10-CM

## 2021-06-17 DIAGNOSIS — L508 Other urticaria: Secondary | ICD-10-CM

## 2021-06-17 MED ORDER — AIRDUO DIGIHALER 232-14 MCG/ACT IN AEPB: 1.0000 | INHALATION_SPRAY | Freq: Two times a day (BID) | RESPIRATORY_TRACT | 5 refills | Status: DC

## 2021-06-17 MED ORDER — XHANCE 93 MCG/ACT NA EXHU: 1.0000 | INHALANT_SUSPENSION | Freq: Two times a day (BID) | NASAL | 5 refills | Status: DC

## 2021-06-17 MED ORDER — EPINEPHRINE 0.3 MG/0.3ML IJ SOAJ: 0.3000 mg | Freq: Once | INTRAMUSCULAR | 2 refills | Status: AC

## 2021-06-17 MED ORDER — RYALTRIS 665-25 MCG/ACT NA SUSP: 1.0000 | Freq: Two times a day (BID) | NASAL | 5 refills | Status: DC

## 2021-06-17 NOTE — Patient Instructions (Addendum)
1. Mild persistent asthma, uncomplicated ?- We could not do lung testing since we did not have the mouthpiece for the breathing machine. ?- We can do this at the next visit instead. ?- Because you are having problems on a fairly routine basis, we are going to start a daily controller medication.  ?- Daily controller medication(s): AirDuo 232/69mcg one puff twice daily (sample provided) ?- Prior to physical activity: albuterol 2 puffs 10-15 minutes before physical activity. ?- Rescue medications: albuterol 4 puffs every 4-6 hours as needed ?- Asthma control goals:  ?* Full participation in all desired activities (may need albuterol before activity) ?* Albuterol use two time or less a week on average (not counting use with activity) ?* Cough interfering with sleep two time or less a month ?* Oral steroids no more than once a year ?* No hospitalizations ? ?2. Anaphylactic shock due to food ?- Continue to avoid soy and mushroom. ?- We are going to give you an EpiPen out of an abundance of caution.  ?- Anaphylaxis management plan provided.  ? ?3. Chronic rhinitis ?- We are going to get testing from [facility].  ?- Continue to Claritin for now (you can alternate antihistamines every 2-3 months to keep them effective).  ?- Stop the Flonase and start Xhance one spray per nostril twice daily (sample provided).  ? ?4. Chronic urticaria ?- Continue with the Claritin daily for now. ?- You can always increase to twice daily if needed. ? ?5. Return in about 6 weeks (around 07/29/2021).  ? ? ?Please inform us of any Emergency Department visits, hospitalizations, or changes in symptoms. Call us before going to the ED for breathing or allergy symptoms since we might be able to fit you in for a sick visit. Feel free to contact us anytime with any questions, problems, or concerns. ? ?It was a pleasure to meet you today! ? ?Websites that have reliable patient information: ?1. American Academy of Asthma, Allergy, and  Immunology: www.aaaai.org ?2. Food Allergy Research and Education (FARE): foodallergy.org ?3. Mothers of Asthmatics: http://www.asthmacommunitynetwork.org ?4. [employer] of Allergy, Asthma, and Immunology: MissingWeapons.ca ? ? ?COVID-19 Vaccine Information can be found at: PodExchange.nl For questions related to vaccine distribution or appointments, please email [email or web address] or call [phone].  ? ?We realize that you might be concerned about having an allergic reaction to the COVID19 vaccines. To help with that concern, WE ARE OFFERING THE COVID19 VACCINES IN OUR OFFICE! Ask the front desk for dates!  ? ? ? ??Like? [place] on Facebook and Instagram for our latest updates!  ?  ? ? ?A healthy democracy works best when [employer] participate! Make sure you are registered to vote! If you have moved or changed any of your contact information, you will need to get this updated before voting! ? ?In some cases, you MAY be able to register to vote online: [email or web address] ? ? ? ? ? ? ? ? ? ? ?

## 2021-06-17 NOTE — Progress Notes (Signed)
? ?NEW PATIENT ? ?Date of Service/Encounter:  06/17/21 ? ?Consult requested by: Ameduite, Trenton Gammon, NP ? ? ?Assessment:  ? ?Mild persistent asthma, uncomplicated ? 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 ?Chronic rhinitis - with testing performed at [facility] in 2020 (getting records) ? ?Chronic urticaria ? ?Plan/Recommendations:  ? ?1. Mild persistent asthma, uncomplicated ?- We could not do lung testing since we did not have the mouthpiece for the breathing machine. ?- We can do this at the next visit instead. ?- Because you are having problems on a fairly routine basis, we are going to start a daily controller medication.  ?- Daily controller medication(s): AirDuo 232/36mcg one puff twice daily (sample provided) ?- Prior to physical activity: albuterol 2 puffs 10-15 minutes before physical activity. ?- Rescue medications: albuterol 4 puffs every 4-6 hours as needed ?- Asthma control goals:  ?* Full participation in all desired activities (may need albuterol before activity) ?* Albuterol use two time or less a week on average (not counting use with activity) ?* Cough interfering with sleep two time or less a month ?* Oral steroids no more than once a year ?* No hospitalizations ? ?2. Anaphylactic shock due to food ?- Continue to avoid soy and mushroom. ?- We are going to give you an EpiPen out of an abundance of caution.  ?- Anaphylaxis management plan provided.  ? ?3. Chronic rhinitis ?- We are going to get testing from [facility].  ?- Continue to Claritin for now (you can alternate antihistamines every 2-3 months to keep them effective).  ?- Stop the Flonase and start Xhance one spray per nostril twice daily (sample provided).  ? ?4. Chronic urticaria ?- Continue with the Claritin daily for now. ?- You can always increase to twice daily if needed. ? ?5. Return in about 6 weeks (around 07/29/2021).  ? ? ? ?This note in its entirety was forwarded to the Provider who  requested this consultation. ? ?Subjective:  ? ?Aviel Vittone is a 36 y.o. female presenting today for evaluation of  ?Chief Complaint  ?Patient presents with  ? Asthma  ?  Sometimes gets tightness in chest/upper back. The tightness started about a year ago. Flare ups at least once or twice a week. Does use her inhaler when a flare up occurs. Has had an allergy test done before but does not remember what she is allergic to. Has been off antihistamines the past 3 days.   ? ? ?Batya Dechristopher has a history of the following: ?Patient Active Problem List  ? Diagnosis Date Noted  ? Right thyroid nodule 01/10/2020  ? Asthma 11/04/2019  ? Subclinical hyperthyroidism 02/11/2019  ? Allergic rhinitis 08/16/2018  ? Essential hypertension 07/17/2018  ? Insomnia 07/17/2018  ? Situational mixed anxiety and depressive disorder 07/17/2018  ? ? ?History obtained from: chart review and patient. ? ?Felis Swadley was referred by Ameduite, Trenton Gammon, NP.    ? ?Damyra is a 36 y.o. female presenting for an evaluation of tightness in the chest and upper back . 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  ?Asthma/Respiratory Symptom History: She has had intermittent chest tightness for one year. She has an albuterol inhaler at home and it does help. She gets a new albuterol one intermittently. She got a new PCP. She reports that she goes to get a new albuterol once per month. She has never been on a daily medication for her asthma. She  does not cough at night.  She has been on steroids twice recently for her chest tightness. This helps somewhat, although it does not clear it up completely. ? ?She has bene to the ED on a couple of occasions. This is in [place]. She had a couple of CXR that showed that she "had asthma", per the patient. Triggers for her symptoms mostly include outdoors.  ? ?Allergic Rhinitis Symptom History: She does have itchy watery eyes and runny nose and sneezing. She had testing performed at [facility] due to a history of hives a couple of years ago. She never followed up really. She was placed on some medications, but it is unclear whether it helped or not. She is currently on Claritin.    She was on cetirizine in the past, but PCP recommended trying Claritin.  ? ?She gets sinus infections around once per year. Otherwise has has no infections at all.  ? ?Food Allergy Symptom History: She breaks out in a rash with mushroom. She also breaks out in hives with soy.  This was confirmed with allergy testing at [facility]. She does not have an EpiPen. She avoids soy in all forms.  ? ?Skin Symptom History: She did have a history of hives, which has resolved for the most part. She has been avoiding the outdoors.  ? ?Otherwise, there is no history of other atopic diseases, including asthma, food allergies, drug allergies, stinging insect allergies, or contact dermatitis. There is no significant infectious history. Vaccinations are up to date.  ? ? ?Past Medical History: ?Patient Active Problem List  ? Diagnosis Date Noted  ? Right thyroid nodule 01/10/2020  ? Asthma 11/04/2019  ? Subclinical hyperthyroidism 02/11/2019  ? Allergic rhinitis 08/16/2018  ? Essential hypertension 07/17/2018  ? Insomnia 07/17/2018  ? Situational mixed anxiety and depressive disorder 07/17/2018  ? ? ?Medication List:  ?Allergies as of 06/17/2021   ? ?   Reactions  ? Pseudoephedrine Palpitations  ? Raises BP also  ? Amoxicillin Itching, Rash  ? ?  ? ?  ?Medication List  ?  ? ?  ? Accurate as of Jun 17, 2021  3:57 PM. If you have any questions, ask your nurse or doctor.  ?  ?  ? ?  ? ?albuterol 108 (90 Base) MCG/ACT inhaler ?Commonly known as: VENTOLIN HFA ?INHALE 2 PUFFS INTO THE LUNGS EVERY 4 HOURS AS NEEDED FOR WHEEZE OR FOR SHORTNESS OF BREATH ?  ?amLODipine 2.5 MG tablet ?Commonly known as: NORVASC ?Take 1 tablet (2.5 mg total) by mouth daily. ?  ?cetirizine 10 MG tablet ?Commonly known as: ZYRTEC ?Take 1 tablet (10 mg total) by  mouth daily. ?  ?fexofenadine 60 MG tablet ?Commonly known as: Allegra Allergy ?Take 1 tablet (60 mg total) by mouth 2 (two) times daily. ?  ?fluticasone 50 MCG/ACT nasal spray ?Commonly known as: FLONASE ?Place 2 sprays into both nostrils daily. ?  ?fluticasone furoate-vilanterol 100-25 MCG/ACT Aepb ?Commonly known as: BREO ELLIPTA ?Inhale 1 puff into the lungs daily. ?  ?Hair Vitamins Tabs ?1 tablet daily ?  ?ibuprofen 800 MG tablet ?Commonly known as: ADVIL ?Take 800 mg by mouth 3 (three) times daily. ?  ?Incassia 0.35 MG tablet ?Generic drug: norethindrone ?Take 1 tablet by mouth daily. ?  ?Iron (Ferrous Sulfate) 325 (65 Fe) MG Tabs ?Take 325 mg by mouth daily. ?  ?Junel 1/20 1-20 MG-MCG tablet ?Generic drug: norethindrone-ethinyl estradiol ?Take 1 tablet by mouth daily. ?  ?lisinopril-hydrochlorothiazide 20-25 MG tablet ?  Commonly known as: ZESTORETIC ?Take 1 tablet by mouth daily. ?  ?metoprolol tartrate 50 MG tablet ?Commonly known as: LOPRESSOR ?Take 1 tablet (50 mg total) by mouth daily. ?  ?montelukast 10 MG tablet ?Commonly known as: SINGULAIR ?Take 1 tablet (10 mg total) by mouth at bedtime. ?  ?potassium chloride SA 20 MEQ tablet ?Commonly known as: KLOR-CON M ?Take 1 tablet (20 mEq total) by mouth daily. ?  ?Prenatal Vitamins 28-0.8 MG Tabs ?1 tablet daily ?  ?Ryaltris [id number] MCG/ACT Susp ?Generic drug: Olopatadine-Mometasone ?Place 1 spray into the nose in the morning and at bedtime. ?Started by: Valentina Shaggy, MD ?  ?Vitamin D3 50 MCG (2000 UT) capsule ?TAKE 1 CAPSULE BY MOUTH EVERY DAY ?  ? ?  ? ? ?Birth History: non-contributory ? ?Developmental History: non-contributory ? ?Past Surgical History: ?History reviewed. No pertinent surgical history. ? ? ?Family History: ?Family History  ?Problem Relation Age of Onset  ? Hypertension Mother   ? Hypertension Father   ? Asthma Sister   ? Allergic rhinitis Paternal Grandmother   ? ? ? ?Social History: Hayslee lives at home with her family.  He lives  in a house that is 37 years old.  There is carpeting throughout the home.  She has electric heating and central cooling.  There are no animals inside or outside of the home.  There are no dust mite covers on th

## 2021-06-18 ENCOUNTER — Encounter: Payer: Self-pay | Admitting: Allergy & Immunology

## 2021-06-18 ENCOUNTER — Other Ambulatory Visit: Payer: Self-pay | Admitting: Nurse Practitioner

## 2021-06-18 MED ORDER — JUNEL 1/20 1-20 MG-MCG PO TABS: 1.0000 | ORAL_TABLET | Freq: Every day | ORAL | 2 refills | Status: DC

## 2021-06-18 MED ORDER — AIRDUO DIGIHALER 232-14 MCG/ACT IN AEPB: 1.0000 | INHALATION_SPRAY | Freq: Two times a day (BID) | RESPIRATORY_TRACT | 5 refills | Status: AC

## 2021-06-27 NOTE — Progress Notes (Signed)
We received the patient's medical records from the previous office.  At her initial visit in October 2021, she was started on Flonase as well as Zyrtec.  She was also started on Xyzal for idiopathic urticaria.  She was given prednisone taper and tacrolimus ointment.  She also received Depo-Medrol 120 mg.  For her cough, she received albuterol and was continued on montelukast.  She was also started on Breo 100 mcg 1 puff once daily.  Her testing at that time was positive to nearly the entire panel.  Copy shown below.  For an unknown reason, she had testing to host of foods which were negative.  She was 1-2+ to soy, but the clinical relevance was unclear.  Salvatore Marvel, MD [facility]

## 2021-07-02 ENCOUNTER — Ambulatory Visit (HOSPITAL_COMMUNITY): Payer: 59

## 2021-07-10 ENCOUNTER — Other Ambulatory Visit: Payer: Self-pay | Admitting: Nurse Practitioner

## 2021-07-10 DIAGNOSIS — I10 Essential (primary) hypertension: Secondary | ICD-10-CM

## 2021-07-16 ENCOUNTER — Encounter: Payer: Self-pay | Admitting: Nurse Practitioner

## 2021-07-16 ENCOUNTER — Ambulatory Visit (INDEPENDENT_AMBULATORY_CARE_PROVIDER_SITE_OTHER): Payer: 59 | Admitting: Nurse Practitioner

## 2021-07-16 VITALS — BP 152/90 | HR 78 | Temp 97.3°F | Ht 60.0 in | Wt 157.0 lb

## 2021-07-16 DIAGNOSIS — D509 Iron deficiency anemia, unspecified: Secondary | ICD-10-CM

## 2021-07-16 DIAGNOSIS — I10 Essential (primary) hypertension: Secondary | ICD-10-CM | POA: Diagnosis not present

## 2021-07-16 MED ORDER — AMLODIPINE BESYLATE 5 MG PO TABS: 5.0000 mg | ORAL_TABLET | Freq: Every day | ORAL | 1 refills | Status: DC

## 2021-07-16 NOTE — Progress Notes (Signed)
Subjective:    Patient ID: Diana [name], female    DOB: 11-25-1985, 36 y.o.   MRN: [id number]  HPI 3 Month follow up for back pain and asthma follow up.  Patient continues to take amlodipine 2.5 mg and lisinopril-hydrochlorothiazide 25 mg / 25 mg for blood pressure.  Patient states her blood pressure at home is around 145/85.  Patient states that she does not have any more back pain.  Patient states that she has been seen by asthma allergy and feels that her asthma is better controlled now.  Patient also states that her menstrual cycle is well controlled with Junel birth control.  Patient denies any side effects from the medication  Patient states that she has not had a chance to get her vaginal ultrasound yet due to work scheduling conflicts.  Overall patient states that she is feeling great and voices no concerns at this time.   Review of Systems  All other systems reviewed and are negative.      Objective:   Physical Exam Vitals reviewed.  Constitutional:      General: She is not in acute distress.    Appearance: Normal appearance. She is obese. She is not ill-appearing, toxic-appearing or diaphoretic.  HENT:     Head: Normocephalic and atraumatic.  Cardiovascular:     Rate and Rhythm: Normal rate and regular rhythm.     Pulses: Normal pulses.     Heart sounds: Normal heart sounds. No murmur heard. Pulmonary:     Effort: Pulmonary effort is normal. No respiratory distress.     Breath sounds: Normal breath sounds. No wheezing.  Abdominal:     General: Abdomen is flat. Bowel sounds are normal. There is no distension.     Palpations: Abdomen is soft. There is no mass.     Tenderness: There is no abdominal tenderness. There is no guarding or rebound.     Hernia: No hernia is present.  Musculoskeletal:     Comments: Grossly intact  Skin:    General: Skin is warm.     Capillary Refill: Capillary refill takes less than 2 seconds.  Neurological:     Mental Status: She  is alert.     Comments: Grossly intact  Psychiatric:        Mood and Affect: Mood normal.        Behavior: Behavior normal.        Assessment & Plan:   1. Essential hypertension -Blood pressure 146/88.  Then 152/90 upon recheck.  Patient not at goal of blood pressure less than 140/90. -We will increase amlodipine from 2.5 mg to 5 mg a day - amLODipine (NORVASC) 5 MG tablet; Take 1 tablet (5 mg total) by mouth daily.  Dispense: 90 tablet; Refill: 1 -Patient given blood pressure log and instructed to upload blood pressure log via MyChart in 4 weeks for evaluation -If blood pressure not adequately controlled with the increase of amlodipine will have patient return to clinic for further evaluation -Otherwise blood pressure adequately controlled we will have patient follow-up in 6 months.  2. Iron deficiency anemia, unspecified iron deficiency anemia type -We will have patient get CBC and iron studies today. -Continue to try and schedule vaginal ultrasound -Continue taking Janel as prescribed for menstrual cycle management -If patient remains anemic we will have patient follow-up for further evaluation -Otherwise if hemoglobin stable will have patient follow-up in 6 months    Note:  This document was prepared using Dragon voice recognition software and  may include unintentional dictation errors. Note - This record has been created using Dragon software.  Chart creation errors have been sought, but may not always  have been located. Such creation errors do not reflect on  the standard of medical care.   

## 2021-07-18 ENCOUNTER — Other Ambulatory Visit: Payer: Self-pay | Admitting: Nurse Practitioner

## 2021-07-18 DIAGNOSIS — D509 Iron deficiency anemia, unspecified: Secondary | ICD-10-CM

## 2021-07-18 LAB — CBC WITH DIFFERENTIAL/PLATELET
Basophils Absolute: 0 10*3/uL (ref 0.0–0.2)
Basos: 1 %
EOS (ABSOLUTE): 0.2 10*3/uL (ref 0.0–0.4)
Eos: 2 %
Hematocrit: 31.9 % — ABNORMAL LOW (ref 34.0–46.6)
Hemoglobin: 10.3 g/dL — ABNORMAL LOW (ref 11.1–15.9)
Immature Grans (Abs): 0 10*3/uL (ref 0.0–0.1)
Immature Granulocytes: 0 %
Lymphocytes Absolute: 2.7 10*3/uL (ref 0.7–3.1)
Lymphs: 33 %
MCH: 23 pg — ABNORMAL LOW (ref 26.6–33.0)
MCHC: 32.3 g/dL (ref 31.5–35.7)
MCV: 71 fL — ABNORMAL LOW (ref 79–97)
Monocytes Absolute: 0.5 10*3/uL (ref 0.1–0.9)
Monocytes: 7 %
Neutrophils Absolute: 4.8 10*3/uL (ref 1.4–7.0)
Neutrophils: 57 %
Platelets: 481 10*3/uL — ABNORMAL HIGH (ref 150–450)
RBC: 4.47 x10E6/uL (ref 3.77–5.28)
RDW: 16 % — ABNORMAL HIGH (ref 11.7–15.4)
WBC: 8.3 10*3/uL (ref 3.4–10.8)

## 2021-07-18 LAB — IRON,TIBC AND FERRITIN PANEL
Ferritin: 10 ng/mL — ABNORMAL LOW (ref 15–150)
Iron Saturation: 8 % — CL (ref 15–55)
Iron: 41 ug/dL (ref 27–159)
Total Iron Binding Capacity: 484 ug/dL — ABNORMAL HIGH (ref 250–450)
UIBC: 443 ug/dL — ABNORMAL HIGH (ref 131–425)

## 2021-07-30 NOTE — Patient Instructions (Addendum)
1. Mild persistent asthma - Daily controller medication(s): AirDuo 232/61mcg one puff twice daily  - Prior to physical activity: albuterol 2 puffs 10-15 minutes before physical activity. 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 - We are going to give you an EpiPen out of an abundance of caution.  - Anaphylaxis management plan provided at last office visit.   3. Seasonal and perennial allergic rhinitis (skin testing from [facility]) - Continue to Claritin for now (you can alternate antihistamines every 2-3 months to keep them effective).  - Continue Xhance one spray per nostril twice daily   4. Chronic urticaria - Continue with the Claritin daily for now. - You can always increase to twice daily if needed. -We will consider lab work if you continue to have hives  5. Schedule a follow up appointment in 3 months or sooner if needed

## 2021-07-31 ENCOUNTER — Other Ambulatory Visit: Payer: Self-pay

## 2021-07-31 ENCOUNTER — Ambulatory Visit: Payer: 59 | Admitting: Family

## 2021-07-31 ENCOUNTER — Encounter: Payer: Self-pay | Admitting: Family

## 2021-07-31 VITALS — BP 138/80 | HR 79 | Resp 16

## 2021-07-31 DIAGNOSIS — L508 Other urticaria: Secondary | ICD-10-CM

## 2021-07-31 DIAGNOSIS — J4530 Mild persistent asthma, uncomplicated: Secondary | ICD-10-CM | POA: Diagnosis not present

## 2021-07-31 DIAGNOSIS — T7800XD Anaphylactic reaction due to unspecified food, subsequent encounter: Secondary | ICD-10-CM

## 2021-07-31 DIAGNOSIS — J302 Other seasonal allergic rhinitis: Secondary | ICD-10-CM

## 2021-07-31 DIAGNOSIS — J3089 Other allergic rhinitis: Secondary | ICD-10-CM

## 2021-07-31 NOTE — Progress Notes (Signed)
[address] [place] [address] Dept: [phone]  FOLLOW UP NOTE  Patient ID: Diana [name], female    DOB: [birth year]  Age: 36 y.o. MRN: [id number] Date of Office Visit: 07/31/2021  Assessment  Chief Complaint: Asthma  HPI Diana [name] is a 36 year old female who presents today for follow-up of mild persistent asthma, anaphylactic shock due to food, seasonal and perennial allergic rhinitis, and chronic urticaria.  She was last seen on Jun 17, 2021 by Dr. Dellis Anes.  Since her last office visit she denies any new diagnoses or surgeries.  Mild persistent asthma is reported as better.  She is currently taking AirDuo 232/14 mcg 1 puff twice a day and albuterol as needed.  She reports a little bit of wheezing and coughing with activity.  She will also notice some tightness in her chest and shortness of breath with certain activities.  She denies nocturnal awakenings due to breathing problems.  Since her last office visit she has not required any systemic steroids or made any trips to the emergency room or urgent care due to breathing problems.  She uses her albuterol approximately once a week. When she does use her albuterol inhaler it does help with her symptoms.  Seasonal and perennial allergic rhinitis is reported as controlled with Claritin 10 mg once a day and Xhance nasal spray.  She does like the Xhance nasal spray better.  She denies rhinorrhea, nasal congestion, and postnasal drip.  She has not had any sinus infections since we last saw her.  She continues to avoid soy and mushroom without any accidental ingestion or use of her epinephrine autoinjector device.  She reports that her EpiPen is up-to-date.  Chronic urticaria: She reports that she had been doing well and had not had hives in a long time until this past Saturday.  She reports on Saturday evening after being at a family barbecue that was outside she developed red spots across her chest.  By the next morning  they were gone.  She denies any new foods or medications.  She was also not using any new products.  She was not sick prior to the hives occurring.  She did get a bug bite that day on her foot while at the reunion.  She denies any fevers, chills, joint pain, or residual bruising.  Drug Allergies:  Allergies  Allergen Reactions   Pseudoephedrine Palpitations    Raises BP also   Amoxicillin Itching and Rash    Review of Systems: Review of Systems  Constitutional:  Negative for chills and fever.  HENT:         Denies rhinorrhea, nasal congestion, and postnasal drip  Eyes:        Denies itchy watery eyes  Respiratory:         Reports little bit of wheezing and coughing with activity.  Also reports little bit of shortness of breath and tightness in chest with certain activities.  Denies nocturnal awakenings due to breathing problems  Cardiovascular:  Negative for chest pain and palpitations.  Gastrointestinal:        Denies heartburn or reflux symptoms  Genitourinary:  Positive for frequency.       .  Reports increased frequency, but also mentions that she is on blood pressure medication.  Skin:        Reports on Saturday evening after being at a family reunion she broke out in hives  Neurological:  Positive for headaches.  Endo/Heme/Allergies:  Positive for environmental allergies.  Physical Exam: BP 138/80   Pulse 79   Resp 16   SpO2 99%    Physical Exam Constitutional:      Appearance: Normal appearance.  HENT:     Head: Normocephalic and atraumatic.     Comments: Pharynx normal, eyes normal, ears normal, nose: Bilateral lower turbinates moderately edematous and slightly erythematous with no drainage noted    Right Ear: Tympanic membrane, ear canal and external ear normal.     Left Ear: Tympanic membrane, ear canal and external ear normal.     Mouth/Throat:     Mouth: Mucous membranes are moist.     Pharynx: Oropharynx is clear.  Eyes:     Conjunctiva/sclera:  Conjunctivae normal.  Cardiovascular:     Rate and Rhythm: Normal rate and regular rhythm.     Heart sounds: Normal heart sounds.  Pulmonary:     Effort: Pulmonary effort is normal.     Breath sounds: Normal breath sounds.     Comments: Lungs clear to auscultation Musculoskeletal:     Cervical back: Neck supple.  Skin:    General: Skin is warm.     Comments: No rash or urticarial lesions noted  Neurological:     Mental Status: She is alert and oriented to person, place, and time.  Psychiatric:        Mood and Affect: Mood normal.        Behavior: Behavior normal.        Thought Content: Thought content normal.        Judgment: Judgment normal.     Diagnostics: FVC 2.51 L (91%), FEV1 2.29 ((98%).  Predicted FVC 2.76 L, predicted FEV1 2.33 L.  Spirometry indicates normal respiratory function.  Assessment and Plan: 1. Mild persistent asthma, uncomplicated   2. Seasonal and perennial allergic rhinitis   3. Anaphylactic shock due to food, subsequent encounter   4. Chronic urticaria     No orders of the defined types were placed in this encounter.   Patient Instructions  1. Mild persistent asthma - Daily controller medication(s): AirDuo 232/105mcg one puff twice daily  - Prior to physical activity: albuterol 2 puffs 10-15 minutes before physical activity. 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 - We are going to give you an EpiPen out of an abundance of caution.  - Anaphylaxis management plan provided at last office visit.   3. Seasonal and perennial allergic rhinitis  (skin testing from [facility]) - Continue to Claritin for now (you can alternate antihistamines every 2-3 months to keep them effective).  - Continue Xhance one spray per nostril twice daily   4. Chronic urticaria - Continue with the Claritin daily for now. - You can always increase to twice daily if needed. -We will consider lab work if you continue to have hives  5. Schedule a follow up appointment in 3 months or sooner if needed     Return in about 3 months (around 10/31/2021), or if symptoms worsen or fail to improve.    Thank you for the opportunity to  care for this patient.  Please do not hesitate to contact me with questions.  Althea Charon, FNP [facility]

## 2021-08-06 ENCOUNTER — Other Ambulatory Visit: Payer: Self-pay | Admitting: Nurse Practitioner

## 2021-08-06 DIAGNOSIS — I10 Essential (primary) hypertension: Secondary | ICD-10-CM

## 2021-08-22 ENCOUNTER — Encounter: Payer: Self-pay | Admitting: Nurse Practitioner

## 2021-08-23 ENCOUNTER — Inpatient Hospital Stay (HOSPITAL_COMMUNITY): Payer: 59 | Attending: Hematology | Admitting: Hematology

## 2021-08-23 ENCOUNTER — Encounter (HOSPITAL_COMMUNITY): Payer: Self-pay | Admitting: Hematology

## 2021-08-23 ENCOUNTER — Other Ambulatory Visit: Payer: Self-pay | Admitting: Nurse Practitioner

## 2021-08-23 VITALS — BP 119/71 | HR 65 | Temp 97.5°F | Resp 18 | Ht 62.0 in | Wt 155.0 lb

## 2021-08-23 DIAGNOSIS — N920 Excessive and frequent menstruation with regular cycle: Secondary | ICD-10-CM | POA: Insufficient documentation

## 2021-08-23 DIAGNOSIS — Z79899 Other long term (current) drug therapy: Secondary | ICD-10-CM | POA: Insufficient documentation

## 2021-08-23 DIAGNOSIS — D509 Iron deficiency anemia, unspecified: Secondary | ICD-10-CM | POA: Insufficient documentation

## 2021-08-23 DIAGNOSIS — F5089 Other specified eating disorder: Secondary | ICD-10-CM | POA: Insufficient documentation

## 2021-08-23 MED ORDER — NORETHINDRONE ACET-ETHINYL EST 1.5-30 MG-MCG PO TABS: 1.0000 | ORAL_TABLET | Freq: Every day | ORAL | 2 refills | Status: DC

## 2021-08-23 NOTE — Progress Notes (Signed)
[facility] [address], [place] [address]   CLINIC:  Medical Oncology/Hematology  Patient Care Team: Ameduite, Alvino Chapel, NP as PCP - General (Nurse Practitioner) Doreatha Massed, MD as Medical Oncologist (Hematology)  CHIEF COMPLAINTS/PURPOSE OF CONSULTATION:  Evaluation for IDA  HISTORY OF PRESENTING ILLNESS:  Diana [name] 36 y.o. female is here because of evaluation for IDA, at the request of [facility].  Today she reports feeling good. She has been taking 1 iron tablet daily for the past 2 months, and she reports tolerating it well; she denies constipation and diarrhea. She reports low energy that has improved slightly since starting iron tablets. She reports she had a miscarriage earlier this year, and se has heavy menses. She reports a history of asthma. She reports she is not currently pregnant. She denies hematuria, hematochezia, and black stools. She reports occasional ice pica. She denies history of iron infusions and blood transfusions.   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 She denies smoking history. Her mother has thalassemia. She denies family history of sickle cell disease and cancer.   MEDICAL HISTORY:  Past Medical History:  Diagnosis Date   Asthma    Hypertension    Urticaria     SURGICAL HISTORY: History reviewed. No pertinent surgical history.  SOCIAL HISTORY: Social History   Socioeconomic History   Marital status: Single    Spouse name: Not on file   Number of children: Not on file   Years of education: Not on file   Highest education level: Not on file  Occupational History   Not on file  Tobacco Use   Smoking status: Never   Smokeless tobacco: Never  Vaping Use   Vaping Use: Never used  Substance and Sexual Activity   Alcohol use: No   Drug use: No   Sexual activity: Not on file  Other Topics Concern   Not on file  Social History Narrative   Not on file   Social Determinants of Health    Financial Resource Strain: Not on file  Food Insecurity: Not on file  Transportation Needs: Not on file  Physical Activity: Not on file  Stress: Not on file  Social Connections: Not on file  Intimate Partner Violence: Not on file    FAMILY HISTORY: Family History  Problem Relation Age of Onset   Hypertension Mother    Hypertension Father    Asthma Sister    Allergic rhinitis Paternal Grandmother     ALLERGIES:  is allergic to pseudoephedrine and amoxicillin.  MEDICATIONS:  Current Outpatient Medications  Medication Sig Dispense Refill   albuterol (VENTOLIN HFA) 108 (90 Base) MCG/ACT inhaler INHALE 2 PUFFS INTO THE LUNGS EVERY 4 HOURS AS NEEDED FOR WHEEZE OR FOR SHORTNESS OF BREATH 18 g 11   amLODipine (NORVASC) 5 MG tablet Take 1 tablet (5 mg total) by mouth daily. 90 tablet 1   Cholecalciferol (VITAMIN D3) 50 MCG (2000 UT) capsule TAKE 1 CAPSULE BY MOUTH EVERY DAY 90 capsule 1   Fluticasone-Salmeterol,sensor, (AIRDUO DIGIHALER) 113-14 MCG/ACT AEPB Inhale into the lungs.     ibuprofen (ADVIL) 800 MG tablet Take 800 mg by mouth 3 (three) times daily.     Iron, Ferrous Sulfate, 325 (65 Fe) MG TABS Take 325 mg by mouth daily. 90 tablet 1   JUNEL 1/20 1-20 MG-MCG tablet Take 1 tablet by mouth daily. 28 tablet 2   lisinopril-hydrochlorothiazide (ZESTORETIC) 20-25 MG tablet Take 1 tablet by mouth daily.  90 tablet 1   metoprolol tartrate (LOPRESSOR) 50 MG tablet Take 1 tablet (50 mg total) by mouth daily. 30 tablet 2   potassium chloride SA (KLOR-CON M20) 20 MEQ tablet Take 0.5 tablets (10 mEq total) by mouth daily. 30 tablet 0   Prenatal Vit-Fe Fumarate-FA (PRENATAL VITAMINS) 28-0.8 MG TABS 1 tablet daily 30 tablet    Fluticasone Propionate (XHANCE) 93 MCG/ACT EXHU Place 1 spray into the nose in the morning and at bedtime. 16 mL 5   No current facility-administered medications for this visit.    REVIEW OF SYSTEMS:   Review of Systems  Constitutional:  Positive for fatigue.  Negative for appetite change.  Gastrointestinal:  Negative for blood in stool, constipation and diarrhea.  Genitourinary:  Positive for menstrual problem (heavy bleeding). Negative for hematuria.   Neurological:  Positive for headaches.  Psychiatric/Behavioral:  Positive for sleep disturbance.   All other systems reviewed and are negative.    PHYSICAL EXAMINATION: ECOG PERFORMANCE STATUS: 0 - Asymptomatic  Vitals:   08/23/21 1130  BP: 119/71  Pulse: 65  Resp: 18  Temp: (!) 97.5 F (36.4 C)  SpO2: 100%   Filed Weights   08/23/21 1130  Weight: 155 lb (70.3 kg)   Physical Exam Vitals reviewed.  Constitutional:      Appearance: Normal appearance.  Cardiovascular:     Rate and Rhythm: Normal rate and regular rhythm.     Pulses: Normal pulses.     Heart sounds: Normal heart sounds.  Pulmonary:     Effort: Pulmonary effort is normal.     Breath sounds: Normal breath sounds.  Abdominal:     Palpations: Abdomen is soft. There is no hepatomegaly, splenomegaly or mass.     Tenderness: There is no abdominal tenderness.  Musculoskeletal:     Right lower leg: No edema.     Left lower leg: No edema.  Neurological:     General: No focal deficit present.     Mental Status: She is alert and oriented to person, place, and time.  Psychiatric:        Mood and Affect: Mood normal.        Behavior: Behavior normal.      LABORATORY DATA:  I have reviewed the data as listed Recent Results (from the past 2160 hour(s))  Fe+TIBC+Fer     Status: Abnormal   Collection Time: 07/17/21  2:56 PM  Result Value Ref Range   Total Iron Binding Capacity 484 (H) 250 - 450 ug/dL   UIBC 841 (H) 324 - 401 ug/dL   Iron 41 27 - 027 ug/dL   Iron Saturation 8 (LL) 15 - 55 %   Ferritin 10 (L) 15 - 150 ng/mL  CBC with Differential     Status: Abnormal   Collection Time: 07/17/21  2:56 PM  Result Value Ref Range   WBC 8.3 3.4 - 10.8 x10E3/uL   RBC 4.47 3.77 - 5.28 x10E6/uL   Hemoglobin 10.3 (L)  11.1 - 15.9 g/dL   Hematocrit 25.3 (L) 66.4 - 46.6 %   MCV 71 (L) 79 - 97 fL   MCH 23.0 (L) 26.6 - 33.0 pg   MCHC 32.3 31.5 - 35.7 g/dL   RDW 40.3 (H) 47.4 - 25.9 %   Platelets 481 (H) 150 - 450 x10E3/uL   Neutrophils 57 Not Estab. %   Lymphs 33 Not Estab. %   Monocytes 7 Not Estab. %   Eos 2 Not Estab. %   Basos 1  Not Estab. %   Neutrophils Absolute 4.8 1.4 - 7.0 x10E3/uL   Lymphocytes Absolute 2.7 0.7 - 3.1 x10E3/uL   Monocytes Absolute 0.5 0.1 - 0.9 x10E3/uL   EOS (ABSOLUTE) 0.2 0.0 - 0.4 x10E3/uL   Basophils Absolute 0.0 0.0 - 0.2 x10E3/uL   Immature Granulocytes 0 Not Estab. %   Immature Grans (Abs) 0.0 0.0 - 0.1 x10E3/uL    RADIOGRAPHIC STUDIES: I have personally reviewed the radiological images as listed and agreed with the findings in the report. No results found.  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  Denies any bleeding per rectum or melena.  Positive for ice pica.  No prior history of transfusion or IV iron. - She reports heavy menses and also had miscarriage earlier this year. - Labs on 07/17/2021: Ferritin 10, hemoglobin 10.3, MCV 71.  Social/family history: - She works at [employer] in [place] as Print production planner for [employer].  Non-smoker. - Mother has thalassemia trait.  No cancers in the family.   PLAN:  Iron deficiency anemia: - She did not have any significant improvement in her hemoglobin after 2 months of oral iron therapy. - We talked about parenteral iron therapy and discussed side effects. - Recommend Feraheme/Venofer for a total of 1 g dose. - RTC 3 months for follow-up with repeat CBC, ferritin and iron panel.   All questions were answered. The patient knows to call the clinic with any problems, questions or concerns.  Doreatha Massed, MD 08/23/21 2:02 PM  [facility] [phone]   I, Alda Ponder, am acting as a scribe for Dr. Doreatha Massed.  I, Doreatha Massed MD, have reviewed the above documentation for accuracy and completeness, and I agree with the above.

## 2021-08-23 NOTE — Patient Instructions (Addendum)
[facility] at [facility] Discharge Instructions  You were seen and examined today by Dr. Ellin Saba. Dr. Ellin Saba is a hematologist, meaning that he specializes in blood abnormalities. Dr. Ellin Saba discussed your past medical history, family history of cancers/blood conditions and the events that led to you being here today.  You were referred to Dr. Ellin Saba due to iron deficiency anemia.  Dr. Ellin Saba has recommended iron infusions. Your labs will be rechecked following the iron infusions.  Follow-up as scheduled.   Thank you for choosing [facility] at [facility] to provide your oncology and hematology care.  To afford each patient quality time with our provider, please arrive at least 15 minutes before your scheduled appointment time.   If you have a lab appointment with the Cancer Center please come in thru the Main Entrance and check in at the main information desk.  You need to re-schedule your appointment should you arrive 10 or more minutes late.  We strive to give you quality time with our providers, and arriving late affects you and other patients whose appointments are after yours.  Also, if you no show three or more times for appointments you may be dismissed from the clinic at the providers discretion.     Again, thank you for choosing [facility]  Our hope is that these requests will decrease the amount of time that you wait before being seen by our physicians.       _____________________________________________________________  Should you have questions after your visit to [facility], please contact our office at [phone] and follow the prompts.  Our office hours are 8:00 a.m. and 4:30 p.m. Monday - Friday.  Please note that voicemails left after 4:00 p.m. may not be returned until the following business day.  We are closed weekends and major holidays.  You do have access to a nurse 24-7, just  call the main number to the clinic [phone] and do not press any options, hold on the line and a nurse will answer the phone.    For prescription refill requests, have your pharmacy contact our office and allow 72 hours.

## 2021-08-26 ENCOUNTER — Other Ambulatory Visit: Payer: Self-pay | Admitting: Nurse Practitioner

## 2021-08-26 DIAGNOSIS — I10 Essential (primary) hypertension: Secondary | ICD-10-CM

## 2021-09-05 DIAGNOSIS — D500 Iron deficiency anemia secondary to blood loss (chronic): Secondary | ICD-10-CM | POA: Insufficient documentation

## 2021-09-12 ENCOUNTER — Other Ambulatory Visit: Payer: Self-pay | Admitting: Nurse Practitioner

## 2021-09-13 ENCOUNTER — Inpatient Hospital Stay: Payer: 59 | Attending: Hematology

## 2021-09-13 VITALS — BP 135/87 | HR 82 | Temp 98.1°F | Resp 18 | Wt 156.3 lb

## 2021-09-13 DIAGNOSIS — D500 Iron deficiency anemia secondary to blood loss (chronic): Secondary | ICD-10-CM

## 2021-09-13 DIAGNOSIS — D509 Iron deficiency anemia, unspecified: Secondary | ICD-10-CM | POA: Insufficient documentation

## 2021-09-13 DIAGNOSIS — Z79899 Other long term (current) drug therapy: Secondary | ICD-10-CM | POA: Insufficient documentation

## 2021-09-13 MED ORDER — SODIUM CHLORIDE 0.9 % IV SOLN
300.0000 mg | Freq: Once | INTRAVENOUS | Status: AC
  Administered 2021-09-13: 300 mg via INTRAVENOUS
  Filled 2021-09-13: qty 300

## 2021-09-13 MED ORDER — LORATADINE 10 MG PO TABS
10.0000 mg | ORAL_TABLET | Freq: Once | ORAL | Status: AC
  Administered 2021-09-13: 10 mg via ORAL

## 2021-09-13 MED ORDER — SODIUM CHLORIDE 0.9 % IV SOLN: Freq: Once | INTRAVENOUS | Status: AC

## 2021-09-13 MED ORDER — AMLODIPINE BESYLATE 5 MG PO TABS
5.0000 mg | ORAL_TABLET | Freq: Once | ORAL | Status: AC
  Administered 2021-09-13: 5 mg via ORAL
  Filled 2021-09-13: qty 1

## 2021-09-13 MED ORDER — ACETAMINOPHEN 325 MG PO TABS
650.0000 mg | ORAL_TABLET | Freq: Once | ORAL | Status: AC
  Administered 2021-09-13: 650 mg via ORAL

## 2021-09-13 NOTE — Progress Notes (Signed)
0839-BP elevated today. Rojelio Brenner PA-C notified.   1021 ok to give her home dose of norvasc per PA.   Iron infusion given per orders. Patient tolerated it well without problems. Vitals stable and discharged home from clinic ambulatory. Follow up as scheduled.

## 2021-09-13 NOTE — Patient Instructions (Signed)
[facility]  Discharge Instructions: Thank you for choosing [facility] to provide your oncology and hematology care.  If you have a lab appointment with the Cancer Center, please come in thru the Main Entrance and check in at the main information desk.  Wear comfortable clothing and clothing appropriate for easy access to any Portacath or PICC line.   We strive to give you quality time with your provider. You may need to reschedule your appointment if you arrive late (15 or more minutes).  Arriving late affects you and other patients whose appointments are after yours.  Also, if you miss three or more appointments without notifying the office, you may be dismissed from the clinic at the provider's discretion.      For prescription refill requests, have your pharmacy contact our office and allow 72 hours for refills to be completed.      To help prevent nausea and vomiting after your treatment, we encourage you to take your nausea medication as directed.  BELOW ARE SYMPTOMS THAT SHOULD BE REPORTED IMMEDIATELY: *FEVER GREATER THAN 100.4 F (38 C) OR HIGHER *CHILLS OR SWEATING *NAUSEA AND VOMITING THAT IS NOT CONTROLLED WITH YOUR NAUSEA MEDICATION *UNUSUAL SHORTNESS OF BREATH *UNUSUAL BRUISING OR BLEEDING *URINARY PROBLEMS (pain or burning when urinating, or frequent urination) *BOWEL PROBLEMS (unusual diarrhea, constipation, pain near the anus) TENDERNESS IN MOUTH AND THROAT WITH OR WITHOUT PRESENCE OF ULCERS (sore throat, sores in mouth, or a toothache) UNUSUAL RASH, SWELLING OR PAIN  UNUSUAL VAGINAL DISCHARGE OR ITCHING   Items with * indicate a potential emergency and should be followed up as soon as possible or go to the Emergency Department if any problems should occur.  Please show the CHEMOTHERAPY ALERT CARD or IMMUNOTHERAPY ALERT CARD at check-in to the Emergency Department and triage nurse.  Should you have questions after your visit or need to  cancel or reschedule your appointment, please contact [facility] [phone]  and follow the prompts.  Office hours are 8:00 a.m. to 4:30 p.m. Monday - Friday. Please note that voicemails left after 4:00 p.m. may not be returned until the following business day.  We are closed weekends and major holidays. You have access to a nurse at all times for urgent questions. Please call the main number to the clinic [phone] and follow the prompts.  For any non-urgent questions, you may also contact your provider using MyChart. We now offer e-Visits for anyone 18 and older to request care online for non-urgent symptoms. For details visit [email or web address].   Also download the MyChart app! Go to the app store, search "MyChart", open the app, select [facility], and log in with your MyChart username and password.  Masks are optional in the cancer centers. If you would like for your care team to wear a mask while they are taking care of you, please let them know. For doctor visits, patients may have with them one support person who is at least 37 years old. At this time, visitors are not allowed in the infusion area.  

## 2021-09-13 NOTE — Progress Notes (Signed)
BP 139/102 - orders received to give amlodipine 5 mg po x 1 dose in clinic.  Orders entered as above.  T.ORojelio Brenner, PA-C/Royalty Fakhouri Yetta Barre, PharmD

## 2021-09-16 ENCOUNTER — Emergency Department (HOSPITAL_COMMUNITY)
Admission: EM | Admit: 2021-09-16 | Discharge: 2021-09-16 | Disposition: A | Discharge status: Home / Self Care | Payer: 59 | Attending: Emergency Medicine | Admitting: Emergency Medicine

## 2021-09-16 ENCOUNTER — Other Ambulatory Visit: Payer: Self-pay

## 2021-09-16 ENCOUNTER — Other Ambulatory Visit: Payer: Self-pay | Admitting: Nurse Practitioner

## 2021-09-16 ENCOUNTER — Encounter (HOSPITAL_COMMUNITY): Payer: Self-pay | Admitting: Emergency Medicine

## 2021-09-16 DIAGNOSIS — Z79899 Other long term (current) drug therapy: Secondary | ICD-10-CM | POA: Diagnosis not present

## 2021-09-16 DIAGNOSIS — I10 Essential (primary) hypertension: Secondary | ICD-10-CM | POA: Diagnosis not present

## 2021-09-16 DIAGNOSIS — N920 Excessive and frequent menstruation with regular cycle: Secondary | ICD-10-CM | POA: Diagnosis present

## 2021-09-16 DIAGNOSIS — D649 Anemia, unspecified: Secondary | ICD-10-CM | POA: Insufficient documentation

## 2021-09-16 LAB — CBC WITH DIFFERENTIAL/PLATELET
Abs Immature Granulocytes: 0.02 10*3/uL (ref 0.00–0.07)
Basophils Absolute: 0 10*3/uL (ref 0.0–0.1)
Basophils Relative: 0 %
Eosinophils Absolute: 0.1 10*3/uL (ref 0.0–0.5)
Eosinophils Relative: 1 %
HCT: 30.7 % — ABNORMAL LOW (ref 36.0–46.0)
Hemoglobin: 9.7 g/dL — ABNORMAL LOW (ref 12.0–15.0)
Immature Granulocytes: 0 %
Lymphocytes Relative: 20 %
Lymphs Abs: 1.8 10*3/uL (ref 0.7–4.0)
MCH: 23.8 pg — ABNORMAL LOW (ref 26.0–34.0)
MCHC: 31.6 g/dL (ref 30.0–36.0)
MCV: 75.4 fL — ABNORMAL LOW (ref 80.0–100.0)
Monocytes Absolute: 0.5 10*3/uL (ref 0.1–1.0)
Monocytes Relative: 5 %
Neutro Abs: 6.8 10*3/uL (ref 1.7–7.7)
Neutrophils Relative %: 74 %
Platelets: 420 10*3/uL — ABNORMAL HIGH (ref 150–400)
RBC: 4.07 MIL/uL (ref 3.87–5.11)
RDW: 17.1 % — ABNORMAL HIGH (ref 11.5–15.5)
WBC: 9.2 10*3/uL (ref 4.0–10.5)
nRBC: 0 % (ref 0.0–0.2)

## 2021-09-16 LAB — BASIC METABOLIC PANEL
Anion gap: 8 (ref 5–15)
BUN: 8 mg/dL (ref 6–20)
CO2: 24 mmol/L (ref 22–32)
Calcium: 8.7 mg/dL — ABNORMAL LOW (ref 8.9–10.3)
Chloride: 104 mmol/L (ref 98–111)
Creatinine, Ser: 0.61 mg/dL (ref 0.44–1.00)
GFR, Estimated: >60 mL/min (ref >60)
Glucose, Bld: 98 mg/dL (ref 70–99)
Potassium: 3.4 mmol/L — ABNORMAL LOW (ref 3.5–5.1)
Sodium: 136 mmol/L (ref 135–145)

## 2021-09-16 NOTE — ED Provider Notes (Signed)
[facility] EMERGENCY DEPARTMENT Provider Note   CSN: [id number] Arrival date & time: 09/16/21  1042     History  Chief Complaint  Patient presents with   Menorrhagia    Diana [name] is a 36 y.o. female with medical history of hypertension, iron deficiency anemia.  Patient presents to the ED for evaluation of menorrhagia.  Patient states beginning on Saturday her menstrual cycle began.  The patient reports that her left menstrual cycle was largely normal until this morning when she woke up and had a large clot expelled from her vagina.  The patient states that she called her sister who is an Charity fundraiser, her sister advised her that she should present to ED because she might "need an iron infusion".  Patient denies any history of receiving iron infusion in the past.  Patient reports she is currently taking ferrous sulfate every day which is prescribed by her PCP.  Patient states that she is also having some lower abdominal cramping/pain. The patient states that she has been taking Midol, ibuprofen at home for her menstrual pain/cramps.  The patient denies any shortness of breath, lightheadedness, dizziness, weakness, fevers, nausea or vomiting.  Patient noted to be hypertensive, states that she has not taken her blood pressure medication today.  Denies chest pain, shortness of breath, headache, blurred vision.  HPI     Home Medications Prior to Admission medications   Medication Sig Start Date End Date Taking? Authorizing Provider  albuterol (VENTOLIN HFA) 108 (90 Base) MCG/ACT inhaler INHALE 2 PUFFS INTO THE LUNGS EVERY 4 HOURS AS NEEDED FOR WHEEZE OR FOR SHORTNESS OF BREATH 04/11/21   Ameduite, Alvino Chapel, NP  amLODipine (NORVASC) 5 MG tablet Take 1 tablet (5 mg total) by mouth daily. 07/16/21   Ameduite, Alvino Chapel, NP  Cholecalciferol (VITAMIN D3) 50 MCG (2000 UT) capsule TAKE 1 CAPSULE BY MOUTH EVERY DAY 06/12/21   Ameduite, Alvino Chapel, NP  Fluticasone Propionate (XHANCE) 93 MCG/ACT EXHU Place 1 spray  into the nose in the morning and at bedtime. 06/17/21 07/17/21  Alfonse Spruce, MD  Fluticasone-Salmeterol,sensor, [facility]) [phone] MCG/ACT AEPB Inhale into the lungs.    [provider]  ibuprofen (ADVIL) 800 MG tablet Take 800 mg by mouth 3 (three) times daily. 02/19/21   [provider]  Iron, Ferrous Sulfate, 325 (65 Fe) MG TABS Take 325 mg by mouth daily. 05/02/21   Ameduite, Alvino Chapel, NP  KLOR-CON M20 20 MEQ tablet TAKE 1/2 TABLETS (10 MEQ TOTAL) BY MOUTH DAILY. 09/02/21   Ameduite, Alvino Chapel, NP  lisinopril-hydrochlorothiazide (ZESTORETIC) 20-25 MG tablet Take 1 tablet by mouth daily. 04/11/21   Ameduite, Alvino Chapel, NP  metoprolol tartrate (LOPRESSOR) 50 MG tablet TAKE 1 TABLET BY MOUTH EVERY DAY 09/16/21   Ameduite, Alvino Chapel, NP  Norethindrone Acetate-Ethinyl Estradiol (LOESTRIN) 1.5-30 MG-MCG tablet Take 1 tablet by mouth daily. 08/23/21   Campbell Riches, NP  Prenatal Vit-Fe Fumarate-FA (PRENATAL VITAMINS) 28-0.8 MG TABS 1 tablet daily 07/16/18   Salley Scarlet, MD      Allergies    Pseudoephedrine and Amoxicillin    Review of Systems   Review of Systems  Constitutional:  Negative for chills and fever.  Respiratory:  Negative for shortness of breath.   Gastrointestinal:  Positive for abdominal pain. Negative for nausea and vomiting.  Genitourinary:  Positive for menstrual problem and vaginal bleeding.  Neurological:  Negative for dizziness, weakness and light-headedness.  All other systems reviewed and are negative.   Physical  Exam Updated Vital Signs BP (!) 172/108 (BP Location: Right Arm)   Pulse 91   Temp 98.2 F (36.8 C) (Oral)   Resp 17   Ht 5\' 2"  (1.575 m)   Wt 70.3 kg   LMP 09/14/2021 (Approximate)   SpO2 96%   BMI 28.35 kg/m  Physical Exam Vitals and nursing note reviewed.  Constitutional:      General: She is not in acute distress.    Appearance: Normal appearance. She is not ill-appearing, toxic-appearing or diaphoretic.  HENT:      Head: Normocephalic and atraumatic.     Nose: Nose normal. No congestion.     Mouth/Throat:     Mouth: Mucous membranes are moist.     Pharynx: Oropharynx is clear.  Eyes:     Extraocular Movements: Extraocular movements intact.     Conjunctiva/sclera: Conjunctivae normal.     Pupils: Pupils are equal, round, and reactive to light.  Cardiovascular:     Rate and Rhythm: Normal rate and regular rhythm.  Pulmonary:     Effort: Pulmonary effort is normal.     Breath sounds: Normal breath sounds. No wheezing.  Abdominal:     General: Abdomen is flat. Bowel sounds are normal.     Palpations: Abdomen is soft.     Tenderness: There is abdominal tenderness (Lower abdominal tenderness, nonfocal) in the suprapubic area.  Musculoskeletal:     Cervical back: Normal range of motion and neck supple. No tenderness.  Skin:    General: Skin is warm and dry.     Capillary Refill: Capillary refill takes less than 2 seconds.  Neurological:     Mental Status: She is alert and oriented to person, place, and time.     GCS: GCS eye subscore is 4. GCS verbal subscore is 5. GCS motor subscore is 6.     Cranial Nerves: Cranial nerves 2-12 are intact. No cranial nerve deficit.     Sensory: Sensation is intact. No sensory deficit.     Motor: Motor function is intact. No weakness.     Coordination: Coordination is intact. Heel to [facility] Test normal.     ED Results / Procedures / Treatments   Labs (all labs ordered are listed, but only abnormal results are displayed) Labs Reviewed  CBC WITH DIFFERENTIAL/PLATELET - Abnormal; Notable for the following components:      Result Value   Hemoglobin 9.7 (*)    HCT 30.7 (*)    MCV 75.4 (*)    MCH 23.8 (*)    RDW 17.1 (*)    Platelets 420 (*)    All other components within normal limits  BASIC METABOLIC PANEL - Abnormal; Notable for the following components:   Potassium 3.4 (*)    Calcium 8.7 (*)    All other components within normal limits     EKG None  Radiology No results found.  Procedures Procedures   Medications Ordered in ED Medications - No data to display  ED Course/ Medical Decision Making/ A&P                           Medical Decision Making Amount and/or Complexity of Data Reviewed Labs: ordered.   36 year old female presents to the ED for evaluation.  Please see HPI for further details.  On examination, the patient is afebrile and nontachycardic.  The patient lung sounds are clear bilaterally, she is not hypoxic on room air.  The patient abdomen is  soft and compressible however the patient does endorse nonfocal tenderness to her suprapubic region.  The patient denies any fevers, vaginal discharge.  Patient neurological examination shows no focal neurodeficits.  Patient worked up utilizing following lab studies interpreted by me personally: - CBC with hemoglobin 9.7.  On chart review, it appears this patient's hemoglobin baseline is around 10.2.  Patient denies any lightheadedness, dizziness, weakness. - Patient states BMP shows decreased potassium to 3.4, the patient has been advised to replete potassium at home utilizing over-the-counter electrolyte supplementation beverages.  She has voiced understanding of these instructions.  At this time, patient hemoglobin stable.  The patient is already on ferrous sulfate supplementation.  Patient denies any lightheadedness, dizziness, weakness.  The patient will be advised to follow-up with her PCP/OB/GYN for further management.  The patient states that she does not have OB/GYN due to the fact that her and her previous provider do not like each other, the patient will be referred to a OB/GYN.    Patient case discussed with my attending Dr. Estell Harpin, he voices agreement with my plan of management.  Changes to plan of management implemented as discussed with my attending.  Patient given return precautions which she voiced understanding with.  The patient had all of  her questions answered to her satisfaction.  The patient is stable at this time for discharge home.  Final Clinical Impression(s) / ED Diagnoses Final diagnoses:  Menorrhagia with regular cycle    Rx / DC Orders ED Discharge Orders     None         Clent Ridges 09/16/21 1338    Bethann Berkshire, MD 09/18/21 1215

## 2021-09-16 NOTE — Discharge Instructions (Signed)
Please return to the ED with any new or worsening symptoms such as the ones we discussed Please follow-up with your PCP.  Please also follow-up with the OB/GYN I referred you to.  Please call and make an appointment to be seen. Please read the attached guide concerning menorrhagia As discussed, I am glad you came in today to be evaluated.  Your lab work is reassuring.  Your hemoglobin is stable.

## 2021-09-16 NOTE — ED Provider Triage Note (Signed)
Emergency Medicine Provider Triage Evaluation Note  Diana [name] , a 36 y.o. female  was evaluated in triage.  Pt complains of menorrhagia.  Patient states her cycles are last night and this morning she noticed she was passing clots.  She states that she started having iron infusions last Friday for anemia.  She also complains of pain from menstrual cramps.  Review of Systems  Positive: As above Negative: As above  Physical Exam  BP (!) 172/108 (BP Location: Right Arm)   Pulse 91   Temp 98.2 F (36.8 C) (Oral)   Resp 17   Ht 5\' 2"  (1.575 m)   Wt 70.3 kg   LMP 09/14/2021 (Approximate)   SpO2 96%   BMI 28.35 kg/m  Gen:   Awake, no distress   Resp:  Normal effort  MSK:   Moves extremities without difficulty  Other:    Medical Decision Making  Medically screening exam initiated at 11:39 AM.  Appropriate orders placed.  Millie Forde was informed that the remainder of the evaluation will be completed by another provider, this initial triage assessment does not replace that evaluation, and the importance of remaining in the ED until their evaluation is complete.     Sharlyne Pacas, PA-C 09/16/21 1140

## 2021-09-16 NOTE — ED Triage Notes (Signed)
Pt presents with a period with large clots that started this am, was told to come in because before she has had to have iron infusion for same situation.

## 2021-09-19 ENCOUNTER — Encounter: Payer: Self-pay | Admitting: Nurse Practitioner

## 2021-09-20 ENCOUNTER — Inpatient Hospital Stay: Payer: 59

## 2021-09-20 VITALS — BP 114/78 | HR 73 | Temp 98.1°F | Resp 18

## 2021-09-20 DIAGNOSIS — D509 Iron deficiency anemia, unspecified: Secondary | ICD-10-CM | POA: Diagnosis not present

## 2021-09-20 DIAGNOSIS — D500 Iron deficiency anemia secondary to blood loss (chronic): Secondary | ICD-10-CM

## 2021-09-20 MED ORDER — LORATADINE 10 MG PO TABS
10.0000 mg | ORAL_TABLET | Freq: Once | ORAL | Status: AC
  Administered 2021-09-20: 10 mg via ORAL
  Filled 2021-09-20: qty 1

## 2021-09-20 MED ORDER — SODIUM CHLORIDE 0.9 % IV SOLN: Freq: Once | INTRAVENOUS | Status: AC

## 2021-09-20 MED ORDER — SODIUM CHLORIDE 0.9 % IV SOLN
300.0000 mg | Freq: Once | INTRAVENOUS | Status: AC
  Administered 2021-09-20: 300 mg via INTRAVENOUS
  Filled 2021-09-20: qty 300

## 2021-09-20 MED ORDER — ACETAMINOPHEN 325 MG PO TABS
650.0000 mg | ORAL_TABLET | Freq: Once | ORAL | Status: AC
  Administered 2021-09-20: 650 mg via ORAL
  Filled 2021-09-20: qty 2

## 2021-09-20 NOTE — Progress Notes (Signed)
Patient presents today for iron infusion.  Patient is in satisfactory condition with no complaints voiced.  Vital signs are stable.  We will proceed with infusion per provider orders.   Patient tolerated treatment well with no complaints voiced.  Patient left ambulatory in stable condition.  Vital signs stable at discharge.  Follow up as scheduled.    

## 2021-09-20 NOTE — Patient Instructions (Signed)
[facility] [place]  Discharge Instructions: Thank you for choosing [facility] to provide your oncology and hematology care.  If you have a lab appointment with the Cancer Center, please come in thru the Main Entrance and check in at the main information desk.  Wear comfortable clothing and clothing appropriate for easy access to any Portacath or PICC line.   We strive to give you quality time with your provider. You may need to reschedule your appointment if you arrive late (15 or more minutes).  Arriving late affects you and other patients whose appointments are after yours.  Also, if you miss three or more appointments without notifying the office, you may be dismissed from the clinic at the provider's discretion.      For prescription refill requests, have your pharmacy contact our office and allow 72 hours for refills to be completed.     To help prevent nausea and vomiting after your treatment, we encourage you to take your nausea medication as directed.  BELOW ARE SYMPTOMS THAT SHOULD BE REPORTED IMMEDIATELY: *FEVER GREATER THAN 100.4 F (38 C) OR HIGHER *CHILLS OR SWEATING *NAUSEA AND VOMITING THAT IS NOT CONTROLLED WITH YOUR NAUSEA MEDICATION *UNUSUAL SHORTNESS OF BREATH *UNUSUAL BRUISING OR BLEEDING *URINARY PROBLEMS (pain or burning when urinating, or frequent urination) *BOWEL PROBLEMS (unusual diarrhea, constipation, pain near the anus) TENDERNESS IN MOUTH AND THROAT WITH OR WITHOUT PRESENCE OF ULCERS (sore throat, sores in mouth, or a toothache) UNUSUAL RASH, SWELLING OR PAIN  UNUSUAL VAGINAL DISCHARGE OR ITCHING   Items with * indicate a potential emergency and should be followed up as soon as possible or go to the Emergency Department if any problems should occur.  Please show the CHEMOTHERAPY ALERT CARD or IMMUNOTHERAPY ALERT CARD at check-in to the Emergency Department and triage nurse.  Should you have questions after your visit or need to  cancel or reschedule your appointment, please contact [facility] [phone]  and follow the prompts.  Office hours are 8:00 a.m. to 4:30 p.m. Monday - Friday. Please note that voicemails left after 4:00 p.m. may not be returned until the following business day.  We are closed weekends and major holidays. You have access to a nurse at all times for urgent questions. Please call the main number to the clinic [phone] and follow the prompts.  For any non-urgent questions, you may also contact your provider using MyChart. We now offer e-Visits for anyone 18 and older to request care online for non-urgent symptoms. For details visit [email or web address].   Also download the MyChart app! Go to the app store, search "MyChart", open the app, select [facility], and log in with your MyChart username and password.  Masks are optional in the cancer centers. If you would like for your care team to wear a mask while they are taking care of you, please let them know. You may have one support person who is at least 37 years old accompany you for your appointments.  

## 2021-09-27 ENCOUNTER — Inpatient Hospital Stay: Payer: 59

## 2021-09-27 VITALS — BP 107/80 | HR 75 | Temp 97.3°F | Resp 18

## 2021-09-27 DIAGNOSIS — D509 Iron deficiency anemia, unspecified: Secondary | ICD-10-CM | POA: Diagnosis not present

## 2021-09-27 DIAGNOSIS — D500 Iron deficiency anemia secondary to blood loss (chronic): Secondary | ICD-10-CM

## 2021-09-27 MED ORDER — SODIUM CHLORIDE 0.9 % IV SOLN
400.0000 mg | Freq: Once | INTRAVENOUS | Status: AC
  Administered 2021-09-27: 400 mg via INTRAVENOUS
  Filled 2021-09-27: qty 20

## 2021-09-27 MED ORDER — SODIUM CHLORIDE 0.9 % IV SOLN: Freq: Once | INTRAVENOUS | Status: AC

## 2021-09-27 MED ORDER — ACETAMINOPHEN 325 MG PO TABS
650.0000 mg | ORAL_TABLET | Freq: Once | ORAL | Status: AC
  Administered 2021-09-27: 650 mg via ORAL
  Filled 2021-09-27: qty 2

## 2021-09-27 MED ORDER — LORATADINE 10 MG PO TABS
10.0000 mg | ORAL_TABLET | Freq: Once | ORAL | Status: AC
  Administered 2021-09-27: 10 mg via ORAL
  Filled 2021-09-27: qty 1

## 2021-09-27 NOTE — Progress Notes (Signed)
Iron infusion given per orders. Patient tolerated it well without problems. Vitals stable and discharged home from clinic ambulatory. Follow up as scheduled.  

## 2021-09-27 NOTE — Patient Instructions (Signed)
[facility]  Discharge Instructions: Thank you for choosing [facility] to provide your oncology and hematology care.  If you have a lab appointment with the Cancer Center, please come in thru the Main Entrance and check in at the main information desk.   We strive to give you quality time with your provider. You may need to reschedule your appointment if you arrive late (15 or more minutes).  Arriving late affects you and other patients whose appointments are after yours.  Also, if you miss three or more appointments without notifying the office, you may be dismissed from the clinic at the provider's discretion.      For prescription refill requests, have your pharmacy contact our office and allow 72 hours for refills to be completed.      To help prevent nausea and vomiting after your treatment, we encourage you to take your nausea medication as directed.  You received an iron infusion today.  BELOW ARE SYMPTOMS THAT SHOULD BE REPORTED IMMEDIATELY: *FEVER GREATER THAN 100.4 F (38 C) OR HIGHER *CHILLS OR SWEATING *NAUSEA AND VOMITING THAT IS NOT CONTROLLED WITH YOUR NAUSEA MEDICATION *UNUSUAL SHORTNESS OF BREATH *UNUSUAL BRUISING OR BLEEDING *URINARY PROBLEMS (pain or burning when urinating, or frequent urination) *BOWEL PROBLEMS (unusual diarrhea, constipation, pain near the anus) TENDERNESS IN MOUTH AND THROAT WITH OR WITHOUT PRESENCE OF ULCERS (sore throat, sores in mouth, or a toothache) UNUSUAL RASH, SWELLING OR PAIN  UNUSUAL VAGINAL DISCHARGE OR ITCHING   Items with * indicate a potential emergency and should be followed up as soon as possible or go to the Emergency Department if any problems should occur.  Please show the CHEMOTHERAPY ALERT CARD or IMMUNOTHERAPY ALERT CARD at check-in to the Emergency Department and triage nurse.  Should you have questions after your visit or need to cancel or reschedule your appointment, please contact  [facility] [phone]  and follow the prompts.  Office hours are 8:00 a.m. to 4:30 p.m. Monday - Friday. Please note that voicemails left after 4:00 p.m. may not be returned until the following business day.  We are closed weekends and major holidays. You have access to a nurse at all times for urgent questions. Please call the main number to the clinic [phone] and follow the prompts.  For any non-urgent questions, you may also contact your provider using MyChart. We now offer e-Visits for anyone 2 and older to request care online for non-urgent symptoms. For details visit [email or web address].   Also download the MyChart app! Go to the app store, search "MyChart", open the app, select [facility], and log in with your MyChart username and password.  Masks are optional in the cancer centers. If you would like for your care team to wear a mask while they are taking care of you, please let them know. You may have one support person who is at least 37 years old accompany you for your appointments.

## 2021-11-15 ENCOUNTER — Inpatient Hospital Stay: Payer: 59

## 2021-11-22 ENCOUNTER — Inpatient Hospital Stay: Payer: 59 | Attending: Hematology

## 2021-11-22 ENCOUNTER — Ambulatory Visit: Payer: 59 | Admitting: Physician Assistant

## 2021-11-22 DIAGNOSIS — N920 Excessive and frequent menstruation with regular cycle: Secondary | ICD-10-CM | POA: Insufficient documentation

## 2021-11-22 DIAGNOSIS — D509 Iron deficiency anemia, unspecified: Secondary | ICD-10-CM | POA: Insufficient documentation

## 2021-11-22 DIAGNOSIS — Z79899 Other long term (current) drug therapy: Secondary | ICD-10-CM | POA: Diagnosis not present

## 2021-11-22 LAB — CBC WITH DIFFERENTIAL/PLATELET
Abs Immature Granulocytes: 0 10*3/uL (ref 0.00–0.07)
Basophils Absolute: 0 10*3/uL (ref 0.0–0.1)
Basophils Relative: 0 %
Eosinophils Absolute: 0.2 10*3/uL (ref 0.0–0.5)
Eosinophils Relative: 3 %
HCT: 34.8 % — ABNORMAL LOW (ref 36.0–46.0)
Hemoglobin: 10.9 g/dL — ABNORMAL LOW (ref 12.0–15.0)
Immature Granulocytes: 0 %
Lymphocytes Relative: 33 %
Lymphs Abs: 2.4 10*3/uL (ref 0.7–4.0)
MCH: 25.6 pg — ABNORMAL LOW (ref 26.0–34.0)
MCHC: 31.3 g/dL (ref 30.0–36.0)
MCV: 81.7 fL (ref 80.0–100.0)
Monocytes Absolute: 0.5 10*3/uL (ref 0.1–1.0)
Monocytes Relative: 7 %
Neutro Abs: 4.2 10*3/uL (ref 1.7–7.7)
Neutrophils Relative %: 57 %
Platelets: 395 10*3/uL (ref 150–400)
RBC: 4.26 MIL/uL (ref 3.87–5.11)
RDW: 15.2 % (ref 11.5–15.5)
WBC: 7.4 10*3/uL (ref 4.0–10.5)
nRBC: 0 % (ref 0.0–0.2)

## 2021-11-22 LAB — FERRITIN: Ferritin: 55 ng/mL (ref 11–307)

## 2021-11-22 LAB — IRON AND TIBC
Iron: 54 ug/dL (ref 28–170)
Saturation Ratios: 16 % (ref 10.4–31.8)
TIBC: 331 ug/dL (ref 250–450)
UIBC: 277 ug/dL

## 2021-11-25 ENCOUNTER — Inpatient Hospital Stay (HOSPITAL_BASED_OUTPATIENT_CLINIC_OR_DEPARTMENT_OTHER): Payer: 59 | Admitting: Physician Assistant

## 2021-11-25 DIAGNOSIS — D500 Iron deficiency anemia secondary to blood loss (chronic): Secondary | ICD-10-CM

## 2021-11-25 NOTE — Progress Notes (Signed)
[facility] [address] [place] [address]   CLINIC:  Medical Oncology/Hematology  Patient Care Team: [facility], Alvino Chapel, NP as PCP - General (Nurse Practitioner) Doreatha Massed, MD as Medical Oncologist (Hematology)  I connected with Diana [name]  on 11/25/21 by telephone visit and verified that I am speaking with the correct person using two identifiers.   I discussed the limitations, risks, security and privacy concerns of performing an evaluation and management service by telemedicine and the availability of in-person appointments. I also discussed with the patient that there may be a patient responsible charge related to this service. The patient expressed understanding and agreed to proceed.   Patient's location: Home Provider's location: Office  CHIEF COMPLAINTS/PURPOSE OF CONSULTATION:  Iron deficiency anemia   HISTORY OF PRESENTING ILLNESS:  Diana [name] 36 y.o. female with history of iron deficiency anemia secondary to menorrhagia.  She was last seen by Dr. Ellin Saba on 08/23/2021.  In the interim, she received IV Venofer 300 mg x 3 doses.  Today Ms. Glad reports slight improvement of her energy levels since undergoing iron infusions.  She continues to struggle with heavy menstrual bleeding.  She is interested in a referral to a gynecologist for further recommendations.  She reports some shortness of breath mainly with exertion but occasionally at rest.  She denies any dizzy spells or syncopal episodes.  She is compliant with taking iron pills once a day as prescribed.  She denies any nausea, vomiting or abdominal pain.  Her bowel habits are unchanged without any recurrent episodes of diarrhea or constipation.  She denies fevers, chills, night sweats, chest pain or cough.  She has no other complaints.   MEDICAL HISTORY:  Past Medical History:  Diagnosis Date   Asthma    Hypertension    Urticaria     SURGICAL HISTORY: No past surgical  history on file.  SOCIAL HISTORY: Social History   Socioeconomic History   Marital status: Single    Spouse name: Not on file   Number of children: Not on file   Years of education: Not on file   Highest education level: Not on file  Occupational History   Not on file  Tobacco Use   Smoking status: Never   Smokeless tobacco: Never  Vaping Use   Vaping Use: Never used  Substance and Sexual Activity   Alcohol use: No   Drug use: No   Sexual activity: Not on file  Other Topics Concern   Not on file  Social History Narrative   Not on file   Social Determinants of Health   Financial Resource Strain: Not on file  Food Insecurity: Not on file  Transportation Needs: Not on file  Physical Activity: Not on file  Stress: Not on file  Social Connections: Not on file  Intimate Partner Violence: Not on file    FAMILY HISTORY: Family History  Problem Relation Age of Onset   Hypertension Mother    Hypertension Father    Asthma Sister    Allergic rhinitis Paternal Grandmother     ALLERGIES:  is allergic to dog epithelium allergy skin test, gramineae pollens, pseudoephedrine, and amoxicillin.  MEDICATIONS:  Current Outpatient Medications  Medication Sig Dispense Refill   albuterol (VENTOLIN HFA) 108 (90 Base) MCG/ACT inhaler INHALE 2 PUFFS INTO THE LUNGS EVERY 4 HOURS AS NEEDED FOR WHEEZE OR FOR SHORTNESS OF BREATH 18 g 11   amLODipine (NORVASC) 5 MG tablet Take 1 tablet (5 mg total) by  mouth daily. 90 tablet 1   Cholecalciferol (VITAMIN D3) 50 MCG (2000 UT) capsule TAKE 1 CAPSULE BY MOUTH EVERY DAY 90 capsule 1   Fluticasone Propionate (XHANCE) 93 MCG/ACT EXHU Place 1 spray into the nose in the morning and at bedtime. 16 mL 5   Fluticasone-Salmeterol,sensor, (AIRDUO DIGIHALER) 113-14 MCG/ACT AEPB Inhale into the lungs.     ibuprofen (ADVIL) 800 MG tablet Take 800 mg by mouth 3 (three) times daily.     Iron, Ferrous Sulfate, 325 (65 Fe) MG TABS Take 325 mg by mouth daily. 90  tablet 1   KLOR-CON M20 20 MEQ tablet TAKE 1/2 TABLETS (10 MEQ TOTAL) BY MOUTH DAILY. 45 tablet 1   lisinopril-hydrochlorothiazide (ZESTORETIC) 20-25 MG tablet Take 1 tablet by mouth daily. 90 tablet 1   metoprolol tartrate (LOPRESSOR) 50 MG tablet TAKE 1 TABLET BY MOUTH EVERY DAY 90 tablet 0   Norethindrone Acetate-Ethinyl Estradiol (LOESTRIN) 1.5-30 MG-MCG tablet Take 1 tablet by mouth daily. 28 tablet 2   Prenatal Vit-Fe Fumarate-FA (PRENATAL VITAMINS) 28-0.8 MG TABS 1 tablet daily 30 tablet    No current facility-administered medications for this visit.    REVIEW OF SYSTEMS:   Review of Systems  Constitutional:  Positive for fatigue. Negative for appetite change.  Respiratory:  Positive for shortness of breath. Negative for cough.   Gastrointestinal:  Negative for abdominal pain, blood in stool, constipation and diarrhea.  Genitourinary:  Positive for menstrual problem (heavy bleeding). Negative for hematuria.   Neurological:  Negative for dizziness.  All other systems reviewed and are negative.    PHYSICAL EXAMINATION: Physical exam was deferred due to virtual visit.   LABORATORY DATA:  I have reviewed the data as listed Recent Results (from the past 2160 hour(s))  CBC with Differential     Status: Abnormal   Collection Time: 09/16/21 12:03 PM  Result Value Ref Range   WBC 9.2 4.0 - 10.5 K/uL   RBC 4.07 3.87 - 5.11 MIL/uL   Hemoglobin 9.7 (L) 12.0 - 15.0 g/dL   HCT 30.7 (L) 36.0 - 46.0 %   MCV 75.4 (L) 80.0 - 100.0 fL   MCH 23.8 (L) 26.0 - 34.0 pg   MCHC 31.6 30.0 - 36.0 g/dL   RDW 17.1 (H) 11.5 - 15.5 %   Platelets 420 (H) 150 - 400 K/uL   nRBC 0.0 0.0 - 0.2 %   Neutrophils Relative % 74 %   Neutro Abs 6.8 1.7 - 7.7 K/uL   Lymphocytes Relative 20 %   Lymphs Abs 1.8 0.7 - 4.0 K/uL   Monocytes Relative 5 %   Monocytes Absolute 0.5 0.1 - 1.0 K/uL   Eosinophils Relative 1 %   Eosinophils Absolute 0.1 0.0 - 0.5 K/uL   Basophils Relative 0 %   Basophils Absolute 0.0  0.0 - 0.1 K/uL   Immature Granulocytes 0 %   Abs Immature Granulocytes 0.02 0.00 - 0.07 K/uL    Comment: Performed at [facility], [address] [place], [place] [address]  Basic metabolic panel     Status: Abnormal   Collection Time: 09/16/21 12:03 PM  Result Value Ref Range   Sodium 136 135 - 145 mmol/L   Potassium 3.4 (L) 3.5 - 5.1 mmol/L   Chloride 104 98 - 111 mmol/L   CO2 24 22 - 32 mmol/L   Glucose, Bld 98 70 - 99 mg/dL    Comment: Glucose reference range applies only to samples taken after fasting for at least 8 hours.  BUN 8 6 - 20 mg/dL   Creatinine, Ser 6.94 0.44 - 1.00 mg/dL   Calcium 8.7 (L) 8.9 - 10.3 mg/dL   GFR, Estimated >85 >46 mL/min    Comment: (NOTE) Calculated using the CKD-EPI Creatinine Equation (2021)    Anion gap 8 5 - 15    Comment: Performed at [facility], [address], [place], [place] [address]  Iron and TIBC [facility]/AP/[facility]/[place] ONLY)     Status: None   Collection Time: 11/22/21  9:24 AM  Result Value Ref Range   Iron 54 28 - 170 ug/dL   TIBC 009 381 - 829 ug/dL   Saturation Ratios 16 10.4 - 31.8 %   UIBC 277 ug/dL    Comment: Performed at [facility], [address] [place], [place] [address]  Ferritin     Status: None   Collection Time: 11/22/21  9:24 AM  Result Value Ref Range   Ferritin 55 11 - 307 ng/mL    Comment: Performed at [facility], [address], [place], [place] [address]  CBC with Differential     Status: Abnormal   Collection Time: 11/22/21  9:24 AM  Result Value Ref Range   WBC 7.4 4.0 - 10.5 K/uL   RBC 4.26 3.87 - 5.11 MIL/uL   Hemoglobin 10.9 (L) 12.0 - 15.0 g/dL   HCT 38.1 (L) 01.7 - 51.0 %   MCV 81.7 80.0 - 100.0 fL   MCH 25.6 (L) 26.0 - 34.0 pg   MCHC 31.3 30.0 - 36.0 g/dL   RDW 25.8 52.7 - 78.2 %   Platelets 395 150 - 400 K/uL   nRBC 0.0 0.0 - 0.2 %   Neutrophils Relative % 57 %   Neutro Abs 4.2 1.7 - 7.7 K/uL   Lymphocytes Relative 33 %   Lymphs Abs 2.4 0.7 - 4.0 K/uL   Monocytes Relative 7 %    Monocytes Absolute 0.5 0.1 - 1.0 K/uL   Eosinophils Relative 3 %   Eosinophils Absolute 0.2 0.0 - 0.5 K/uL   Basophils Relative 0 %   Basophils Absolute 0.0 0.0 - 0.1 K/uL   Immature Granulocytes 0 %   Abs Immature Granulocytes 0.00 0.00 - 0.07 K/uL    Comment: Performed at [facility], [address] [place], [place] [address]    RADIOGRAPHIC STUDIES: I have personally reviewed the radiological images as listed and agreed with the findings in the report. No results found.  ASSESSMENT and PLAN:  Diana [name] is a 36 y.o. female who presents for a follow up for iron deficiency anemia.   #IDA from chronic menstrual blood loss: - She reports heavy menses and also had miscarriage earlier this year. - Received IV venofer 300 mg x 3 doses from 09/13/2021-09/27/2021. --Labs from today show slight improvement of aneima with Hgb 10.9, MCV 81.7. Iron panel shows improvement with iron 54, TIBC 331, saturation 16%, ferritin 55.  --Recommend IV venofer 300 mg x 1 dose to help bolster levels --Continue on ferrous sulfate 325 mg once a day with a source of vitamin C.  --Due to persistent menstrual bleeding, we will make referral to gynecology to further recommendations.  --RTC in 3 months with labs   All questions were answered. The patient knows to call the clinic with any problems, questions or concerns.  I have spent a total of 25 minutes minutes of non-face-to-face time, preparing to see the patient, performing a medically appropriate examination, counseling and educating the patient, ordering medications/tests, referring  and communicating with other health care professionals, documenting clinical information in the electronic health record, independently interpreting results and communicating results to the patient, and care coordination.   Dede Query PA-C Dept of Hematology and Hope at [facility] Phone: [phone]

## 2021-11-25 NOTE — Patient Instructions (Incomplete)
1. Mild persistent asthma - Daily controller medication(s): AirDuo 232/35mcg one puff twice daily  - Prior to physical activity: albuterol 2 puffs 10-15 minutes before physical activity. 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 - We are going to give you an EpiPen out of an abundance of caution.  - Anaphylaxis management plan provided at last office visit.   3. Seasonal and perennial allergic rhinitis (skin testing from [facility]) - Continue to Claritin for now (you can alternate antihistamines every 2-3 months to keep them effective).  - Continue Xhance one spray per nostril twice daily   4. Chronic urticaria - Continue with the Claritin daily for now. - You can always increase to twice daily if needed. -We will consider lab work if you continue to have hives  5. Schedule a follow up appointment in  months or sooner if needed

## 2021-11-27 ENCOUNTER — Ambulatory Visit: Payer: 59 | Admitting: Family

## 2021-11-30 ENCOUNTER — Encounter: Payer: Self-pay | Admitting: Emergency Medicine

## 2021-11-30 ENCOUNTER — Ambulatory Visit
Admission: EM | Admit: 2021-11-30 | Discharge: 2021-11-30 | Disposition: A | Discharge status: Home / Self Care | Payer: 59 | Attending: Nurse Practitioner | Admitting: Nurse Practitioner

## 2021-11-30 DIAGNOSIS — H65191 Other acute nonsuppurative otitis media, right ear: Secondary | ICD-10-CM | POA: Diagnosis not present

## 2021-11-30 MED ORDER — PREDNISONE 20 MG PO TABS: 20.0000 mg | ORAL_TABLET | Freq: Every day | ORAL | 0 refills | Status: AC

## 2021-11-30 NOTE — Discharge Instructions (Signed)
-  Take medication as prescribed. -Continue your current allergy regimen. -May take OTC Tylenol or Ibuprofen as needed for pain or discomfort. otitis media Take medication as prescribed. Warm compresses to the affected ear help with comfort. Do not stick anything inside the ear while symptoms persist. Avoid getting water inside of the ear while symptoms persist. Follow-up with your primary care physician or Ear, Nose and Throat if symptoms do not improve.

## 2021-11-30 NOTE — ED Provider Notes (Signed)
[facility]    CSN: [id number] Arrival date & time: 11/30/21  1141      History   Chief Complaint Chief Complaint  Patient presents with   Ear Drainage    Possible ear infection - Entered by patient    HPI Diana [name] is a 36 y.o. female.   The history is provided by the patient.   The patient presents with a 1 day history of right ear pain.  She states that symptoms started in the left ear, but have since resolved.  She states that she feels like she is "underwater" and can hear popping and crackling in her ear.  She also states that the pain worsens when she lies flat.  She denies fever, chills, cough, runny nose, nasal congestion, headache, or sore throat.  She states that she does have a history of seasonal allergies, and is under the care of an allergist.  Patient states that she currently takes Claritin and uses Flonase for her allergy symptoms.    Past Medical History:  Diagnosis Date   Asthma    Hypertension    Urticaria     Patient Active Problem List   Diagnosis Date Noted   Iron deficiency anemia due to chronic blood loss 09/05/2021   Right thyroid nodule 01/10/2020   Asthma 11/04/2019   Subclinical hyperthyroidism 02/11/2019   Allergic rhinitis 08/16/2018   Essential hypertension 07/17/2018   Insomnia 07/17/2018   Situational mixed anxiety and depressive disorder 07/17/2018    History reviewed. No pertinent surgical history.  OB History   No obstetric history on file.      Home Medications    Prior to Admission medications   Medication Sig Start Date End Date Taking? Authorizing Provider  predniSONE (DELTASONE) 20 MG tablet Take 1 tablet (20 mg total) by mouth daily with breakfast for 5 days. 11/30/21 12/05/21 Yes Verl Kitson-Warren, Sadie Haber, NP  albuterol (VENTOLIN HFA) 108 (90 Base) MCG/ACT inhaler INHALE 2 PUFFS INTO THE LUNGS EVERY 4 HOURS AS NEEDED FOR WHEEZE OR FOR SHORTNESS OF BREATH 04/11/21   [employer]med[employer], Alvino Chapel, NP  amLODipine  (NORVASC) 5 MG tablet Take 1 tablet (5 mg total) by mouth daily. 07/16/21   [employer], Alvino Chapel, NP  Cholecalciferol (VITAMIN D3) 50 MCG (2000 UT) capsule TAKE 1 CAPSULE BY MOUTH EVERY DAY 06/12/21   [employer]med[employer], Alvino Chapel, NP  Fluticasone Propionate (XHANCE) 93 MCG/ACT EXHU Place 1 spray into the nose in the morning and at bedtime. 06/17/21 07/17/21  Alfonse Spruce, MD  Fluticasone-Salmeterol,sensor, [facility]) [phone] MCG/ACT AEPB Inhale into the lungs.    [provider]  ibuprofen (ADVIL) 800 MG tablet Take 800 mg by mouth 3 (three) times daily. 02/19/21   [provider]  Iron, Ferrous Sulfate, 325 (65 Fe) MG TABS Take 325 mg by mouth daily. 05/02/21   [employer]med[employer], Alvino Chapel, NP  KLOR-CON M20 20 MEQ tablet TAKE 1/2 TABLETS (10 MEQ TOTAL) BY MOUTH DAILY. 09/02/21   [employer]med[employer], Alvino Chapel, NP  lisinopril-hydrochlorothiazide (ZESTORETIC) 20-25 MG tablet Take 1 tablet by mouth daily. 04/11/21   [employer]med[employer], Alvino Chapel, NP  metoprolol tartrate (LOPRESSOR) 50 MG tablet TAKE 1 TABLET BY MOUTH EVERY DAY 09/16/21   [employer]med[employer], Alvino Chapel, NP  Norethindrone Acetate-Ethinyl Estradiol (LOESTRIN) 1.5-30 MG-MCG tablet Take 1 tablet by mouth daily. 08/23/21   Campbell Riches, NP  Prenatal Vit-Fe Fumarate-FA (PRENATAL VITAMINS) 28-0.8 MG TABS 1 tablet daily 07/16/18   Salley Scarlet, MD    Family History Family  History  Problem Relation Age of Onset   Hypertension Mother    Hypertension Father    Asthma Sister    Allergic rhinitis Paternal Grandmother     Social History Social History   Tobacco Use   Smoking status: Never   Smokeless tobacco: Never  Vaping Use   Vaping Use: Never used  Substance Use Topics   Alcohol use: No   Drug use: No     Allergies   Dog epithelium allergy skin test, Gramineae pollens, Pseudoephedrine, and Amoxicillin   Review of Systems Review of Systems Per HPI  Physical Exam Triage Vital Signs ED Triage Vitals  Enc Vitals Group     BP 11/30/21 1241 (!)  143/87     Pulse Rate 11/30/21 1241 81     Resp 11/30/21 1241 18     Temp 11/30/21 1241 98.9 F (37.2 C)     Temp Source 11/30/21 1241 Oral     SpO2 11/30/21 1241 99 %     Weight --      Height --      Head Circumference --      Peak Flow --      Pain Score 11/30/21 1242 8     Pain Loc --      Pain Edu? --      Excl. in GC? --    No data found.  Updated Vital Signs BP (!) 143/87 (BP Location: Right Arm)   Pulse 81   Temp 98.9 F (37.2 C) (Oral)   Resp 18   LMP 11/11/2021 (Exact Date)   SpO2 99%   Visual Acuity Right Eye Distance:   Left Eye Distance:   Bilateral Distance:    Right Eye Near:   Left Eye Near:    Bilateral Near:     Physical Exam Vitals and nursing note reviewed.  Constitutional:      General: She is not in acute distress.    Appearance: Normal appearance.  HENT:     Head: Normocephalic.     Right Ear: Ear canal and external ear normal.     Left Ear: Tympanic membrane, ear canal and external ear normal.     Nose: Nose normal.     Mouth/Throat:     Mouth: Mucous membranes are moist.  Eyes:     Conjunctiva/sclera: Conjunctivae normal.     Pupils: Pupils are equal, round, and reactive to light.  Cardiovascular:     Rate and Rhythm: Normal rate and regular rhythm.     Pulses: Normal pulses.     Heart sounds: Normal heart sounds.  Pulmonary:     Effort: Pulmonary effort is normal. No respiratory distress.     Breath sounds: Normal breath sounds. No stridor. No wheezing, rhonchi or rales.  Abdominal:     General: Bowel sounds are normal.     Palpations: Abdomen is soft.     Tenderness: There is no abdominal tenderness.  Musculoskeletal:     Cervical back: Normal range of motion.  Lymphadenopathy:     Cervical: No cervical adenopathy.  Skin:    General: Skin is warm and dry.  Neurological:     General: No focal deficit present.     Mental Status: She is alert and oriented to person, place, and time.  Psychiatric:        Mood and Affect:  Mood normal.        Behavior: Behavior normal.      UC Treatments / Results  Labs (  all labs ordered are listed, but only abnormal results are displayed) Labs Reviewed - No data to display  EKG   Radiology No results found.  Procedures Procedures (including critical care time)  Medications Ordered in UC Medications - No data to display  Initial Impression / Assessment and Plan / UC Course  I have reviewed the triage vital signs and the nursing notes.  Pertinent labs & imaging results that were available during my care of the patient were reviewed by me and considered in my medical decision making (see chart for details).  Patient presents for complaints of right ear pain that started 1 day ago.  Patient is well-appearing, she is in no acute distress, vital signs are stable.  Patient has a right middle ear effusion seen on exam.  Symptoms are consistent with an acute middle ear effusion.  Patient is currently taking Flonase and using an antihistamine for her symptoms.  We will start patient on prednisone 20 mg for 5 days at this time.  Supportive care recommendations were provided to the patient.  Patient was advised that if symptoms fail to improve, recommend that she follow-up with her primary care physician or with ear nose and throat.  Patient verbalizes understanding.  All questions were answered.  Patient is stable for discharge. Final Clinical Impressions(s) / UC Diagnoses   Final diagnoses:  Acute middle ear effusion, right     Discharge Instructions      -Take medication as prescribed. -Continue your current allergy regimen. -May take OTC Tylenol or Ibuprofen as needed for pain or discomfort. otitis media Take medication as prescribed. Warm compresses to the affected ear help with comfort. Do not stick anything inside the ear while symptoms persist. Avoid getting water inside of the ear while symptoms persist. Follow-up with your primary care physician or Ear,  Nose and Throat if symptoms do not improve.      ED Prescriptions     Medication Sig Dispense Auth. Provider   predniSONE (DELTASONE) 20 MG tablet Take 1 tablet (20 mg total) by mouth daily with breakfast for 5 days. 5 tablet Mario Voong-Warren, Alda Lea, NP      PDMP not reviewed this encounter.   Tish Men, NP 11/30/21 1322

## 2021-11-30 NOTE — ED Triage Notes (Signed)
Right ear pain since yesterday.  Has been taking ibuprofen to help with pain without relief.

## 2021-12-01 ENCOUNTER — Ambulatory Visit: Payer: Self-pay

## 2021-12-03 ENCOUNTER — Encounter: Payer: Self-pay | Admitting: Nurse Practitioner

## 2021-12-22 ENCOUNTER — Telehealth: Payer: 59 | Admitting: Family

## 2021-12-22 ENCOUNTER — Telehealth: Payer: 59 | Admitting: Emergency Medicine

## 2021-12-22 DIAGNOSIS — L239 Allergic contact dermatitis, unspecified cause: Secondary | ICD-10-CM

## 2021-12-22 DIAGNOSIS — L245 Irritant contact dermatitis due to other chemical products: Secondary | ICD-10-CM | POA: Diagnosis not present

## 2021-12-22 MED ORDER — TRIAMCINOLONE ACETONIDE 0.5 % EX OINT: 1.0000 | TOPICAL_OINTMENT | Freq: Two times a day (BID) | CUTANEOUS | 0 refills | Status: DC

## 2021-12-22 NOTE — Progress Notes (Signed)
Virtual Visit Consent   Diana [name], you are scheduled for a virtual visit with a [facility] provider today. Just as with appointments in the office, your consent must be obtained to participate. Your consent will be active for this visit and any virtual visit you may have with one of our providers in the next 365 days. If you have a MyChart account, a copy of this consent can be sent to you electronically.  As this is a virtual visit, video technology does not allow for your provider to perform a traditional examination. This may limit your provider's ability to fully assess your condition. If your provider identifies any concerns that need to be evaluated in person or the need to arrange testing (such as labs, EKG, etc.), we will make arrangements to do so. Although advances in technology are sophisticated, we cannot ensure that it will always work on either your end or our end. If the connection with a video visit is poor, the visit may have to be switched to a telephone visit. With either a video or telephone visit, we are not always able to ensure that we have a secure connection.  By engaging in this virtual visit, you consent to the provision of healthcare and authorize for your insurance to be billed (if applicable) for the services provided during this visit. Depending on your insurance coverage, you may receive a charge related to this service.  I need to obtain your verbal consent now. Are you willing to proceed with your visit today? Diana [name] has provided verbal consent on 12/22/2021 for a virtual visit (video or telephone). Cathlyn Parsons, NP  Date: 12/22/2021 11:26 AM  Virtual Visit via Video Note   I, Cathlyn Parsons, connected with  Diana [name]  ([id number], 02/19/1985) on 12/22/21 at 11:15 AM EST by a video-enabled telemedicine application and verified that I am speaking with the correct person using two identifiers.  Location: Patient: Virtual Visit Location Patient:  Home Provider: Virtual Visit Location Provider: Home Office   I discussed the limitations of evaluation and management by telemedicine and the availability of in person appointments. The patient expressed understanding and agreed to proceed.    History of Present Illness: Diana [name] is a 36 y.o. who identifies as a female who was assigned female at birth, and is being seen today for skin irritation.  Patient had her eyebrows waxed on 12/20/2021.  Later that evening and the next day and today, her eyebrows, where the material used to wax them was in contact with her skin, is swollen, itchy, and there is rash in between her eyebrows.  She takes Claritin every day for allergies anyway.  She took a dose of Benadryl yesterday that did not seem to help.  She has tried hydrocortisone cream, last use last night.  And she has tried cool compresses.  None of these remedies have solved her symptoms.  HPI: HPI  Problems:  Patient Active Problem List   Diagnosis Date Noted   Iron deficiency anemia due to chronic blood loss 09/05/2021   Right thyroid nodule 01/10/2020   Asthma 11/04/2019   Subclinical hyperthyroidism 02/11/2019   Allergic rhinitis 08/16/2018   Essential hypertension 07/17/2018   Insomnia 07/17/2018   Situational mixed anxiety and depressive disorder 07/17/2018    Allergies:  Allergies  Allergen Reactions   Dog Epithelium Allergy Skin Test Itching   Gramineae Pollens Rash   Pseudoephedrine Palpitations    Raises BP also   Amoxicillin Itching and Rash  Medications:  Current Outpatient Medications:    albuterol (VENTOLIN HFA) 108 (90 Base) MCG/ACT inhaler, INHALE 2 PUFFS INTO THE LUNGS EVERY 4 HOURS AS NEEDED FOR WHEEZE OR FOR SHORTNESS OF BREATH, Disp: 18 g, Rfl: 11   amLODipine (NORVASC) 5 MG tablet, Take 1 tablet (5 mg total) by mouth daily., Disp: 90 tablet, Rfl: 1   Cholecalciferol (VITAMIN D3) 50 MCG (2000 UT) capsule, TAKE 1 CAPSULE BY MOUTH EVERY DAY, Disp: 90 capsule,  Rfl: 1   Fluticasone Propionate (XHANCE) 93 MCG/ACT EXHU, Place 1 spray into the nose in the morning and at bedtime., Disp: 16 mL, Rfl: 5   Fluticasone-Salmeterol,sensor, (AIRDUO DIGIHALER) 113-14 MCG/ACT AEPB, Inhale into the lungs., Disp: , Rfl:    ibuprofen (ADVIL) 800 MG tablet, Take 800 mg by mouth 3 (three) times daily., Disp: , Rfl:    Iron, Ferrous Sulfate, 325 (65 Fe) MG TABS, Take 325 mg by mouth daily., Disp: 90 tablet, Rfl: 1   KLOR-CON M20 20 MEQ tablet, TAKE 1/2 TABLETS (10 MEQ TOTAL) BY MOUTH DAILY., Disp: 45 tablet, Rfl: 1   lisinopril-hydrochlorothiazide (ZESTORETIC) 20-25 MG tablet, Take 1 tablet by mouth daily., Disp: 90 tablet, Rfl: 1   metoprolol tartrate (LOPRESSOR) 50 MG tablet, TAKE 1 TABLET BY MOUTH EVERY DAY, Disp: 90 tablet, Rfl: 0   Norethindrone Acetate-Ethinyl Estradiol (LOESTRIN) 1.5-30 MG-MCG tablet, Take 1 tablet by mouth daily., Disp: 28 tablet, Rfl: 2   Prenatal Vit-Fe Fumarate-FA (PRENATAL VITAMINS) 28-0.8 MG TABS, 1 tablet daily, Disp: 30 tablet, Rfl:    triamcinolone ointment (KENALOG) 0.5 %, Apply 1 Application topically 2 (two) times daily., Disp: 30 g, Rfl: 0  Observations/Objective: Patient is well-developed, well-nourished in no acute distress.  Resting comfortably  at home.  Head is normocephalic, atraumatic.  No labored breathing.  Speech is clear and coherent with logical content.  Patient is alert and oriented at baseline.  I can see on video that her skin around her eyebrows looks irritated, her eyebrows appear a little swollen, and there is a erythematous, scaly rash in between her eyebrows.  Assessment and Plan: 1. Irritant contact dermatitis due to other chemical products  Advised her to continue taking her Claritin, and she can take Benadryl in addition to that if needed for itching.  Try using hydrocortisone cream 3-4 times a day in the affected area.  Use ice packs or cold compresses 3 or more times a day to try to help with the swelling.   She is doing all of the right treatments, and will just take time for this to heal.  Follow Up Instructions: I discussed the assessment and treatment plan with the patient. The patient was provided an opportunity to ask questions and all were answered. The patient agreed with the plan and demonstrated an understanding of the instructions.  A copy of instructions were sent to the patient via MyChart unless otherwise noted below.   The patient was advised to call back or seek an in-person evaluation if the symptoms worsen or if the condition fails to improve as anticipated.  Time:  I spent 8 minutes with the patient via telehealth technology discussing the above problems/concerns.    Cathlyn Parsons, NP

## 2021-12-22 NOTE — Progress Notes (Signed)
E Visit for Rash  We are sorry that you are not feeling well. Here is how we plan to help!  Based on what you shared with me it looks like you have contact dermatitis.  Contact dermatitis is a skin rash caused by something that touches the skin and causes irritation or inflammation.  Your skin may be red, swollen, dry, cracked, and itch.  The rash should go away in a few days but can last a few weeks.  If you get a rash, it's important to figure out what caused it so the irritant can be avoided in the future.  I have sent in Kenalog cream, a steroid cream, that you will apply twice a day.    HOME CARE:  Take cool showers and avoid direct sunlight. Apply cool compress or wet dressings. Take a bath in an oatmeal bath.  Sprinkle content of one Aveeno packet under running faucet with comfortably warm water.  Bathe for 15-20 minutes, 1-2 times daily.  Pat dry with a towel. Do not rub the rash. Use hydrocortisone cream. Take an antihistamine like Benadryl for widespread rashes that itch.  The adult dose of Benadryl is 25-50 mg by mouth 4 times daily. Caution:  This type of medication may cause sleepiness.  Do not drink alcohol, drive, or operate dangerous machinery while taking antihistamines.  Do not take these medications if you have prostate enlargement.  Read package instructions thoroughly on all medications that you take.  GET HELP RIGHT AWAY IF:  Symptoms don't go away after treatment. Severe itching that persists. If you rash spreads or swells. If you rash begins to smell. If it blisters and opens or develops a yellow-brown crust. You develop a fever. You have a sore throat. You become short of breath.  MAKE SURE YOU:  Understand these instructions. Will watch your condition. Will get help right away if you are not doing well or get worse.  Thank you for choosing an e-visit.  Your e-visit answers were reviewed by a board certified advanced clinical practitioner to complete your  personal care plan. Depending upon the condition, your plan could have included both over the counter or prescription medications.  Please review your pharmacy choice. Make sure the pharmacy is open so you can pick up prescription now. If there is a problem, you may contact your provider through [employer] and have the prescription routed to another pharmacy.  Your safety is important to [place]. If you have drug allergies check your prescription carefully.   For the next 24 hours you can use MyChart to ask questions about today's visit, request a non-urgent call back, or ask for a work or school excuse. You will get an email in the next two days asking about your experience. I hope that your e-visit has been valuable and will speed your recovery.  Approximately 5 minutes was spent documenting and reviewing patient's chart.

## 2021-12-22 NOTE — Patient Instructions (Signed)
Diana [name], thank you for joining Diana Parsons, NP for today's virtual visit.  While this provider is not your primary care provider (PCP), if your PCP is located in our provider database this encounter information will be shared with them immediately following your visit.   A Diana [name] account gives you access to today's visit and all your visits, tests, and labs performed at [facility] " click here if you don't have a Diana [name] account or go to [name]  Consent: (Patient) Diana [name] provided verbal consent for this virtual visit at the beginning of the encounter.  Current Medications:  Current Outpatient Medications:    albuterol (VENTOLIN HFA) 108 (90 Base) MCG/ACT inhaler, INHALE 2 PUFFS INTO THE LUNGS EVERY 4 HOURS AS NEEDED FOR WHEEZE OR FOR SHORTNESS OF BREATH, Disp: 18 g, Rfl: 11   amLODipine (NORVASC) 5 MG tablet, Take 1 tablet (5 mg total) by mouth daily., Disp: 90 tablet, Rfl: 1   Cholecalciferol (VITAMIN D3) 50 MCG (2000 UT) capsule, TAKE 1 CAPSULE BY MOUTH EVERY DAY, Disp: 90 capsule, Rfl: 1   Fluticasone Propionate (XHANCE) 93 MCG/ACT EXHU, Place 1 spray into the nose in the morning and at bedtime., Disp: 16 mL, Rfl: 5   Fluticasone-Salmeterol,sensor, (AIRDUO DIGIHALER) 113-14 MCG/ACT AEPB, Inhale into the lungs., Disp: , Rfl:    ibuprofen (ADVIL) 800 MG tablet, Take 800 mg by mouth 3 (three) times daily., Disp: , Rfl:    Iron, Ferrous Sulfate, 325 (65 Fe) MG TABS, Take 325 mg by mouth daily., Disp: 90 tablet, Rfl: 1   KLOR-CON M20 20 MEQ tablet, TAKE 1/2 TABLETS (10 MEQ TOTAL) BY MOUTH DAILY., Disp: 45 tablet, Rfl: 1   lisinopril-hydrochlorothiazide (ZESTORETIC) 20-25 MG tablet, Take 1 tablet by mouth daily., Disp: 90 tablet, Rfl: 1   metoprolol tartrate (LOPRESSOR) 50 MG tablet, TAKE 1 TABLET BY MOUTH EVERY DAY, Disp: 90 tablet, Rfl: 0   Norethindrone Acetate-Ethinyl Estradiol (LOESTRIN) 1.5-30 MG-MCG tablet, Take 1 tablet  by mouth daily., Disp: 28 tablet, Rfl: 2   Prenatal Vit-Fe Fumarate-FA (PRENATAL VITAMINS) 28-0.8 MG TABS, 1 tablet daily, Disp: 30 tablet, Rfl:    triamcinolone ointment (KENALOG) 0.5 %, Apply 1 Application topically 2 (two) times daily., Disp: 30 g, Rfl: 0   Medications ordered in this encounter:  No orders of the defined types were placed in this encounter.    *If you need refills on other medications prior to your next appointment, please contact your pharmacy*  Follow-Up: Call back or seek an in-person evaluation if the symptoms worsen or if the condition fails to improve as anticipated.  [facility] [phone]  Other Instructions Continue taking Claritin. You can take Benadryl in addition to that if needed for itching.  Try using hydrocortisone cream 3-4 times a day in the affected area.  Use ice packs or cold compresses 3 or more times a day to try to help with the swelling.  You are doing all of the right treatments, it will just take time for this to heal.   If you have been instructed to have an in-person evaluation today at a local Urgent Care facility, please use the link below. It will take you to a list of all of our available [facility], including address, phone number and hours of operation. Please do not delay care.  [facility]  If you or a family member do not have a primary care provider, use the link below  to schedule a visit and establish care. When you choose a [facility] primary care physician or advanced practice provider, you gain a long-term partner in health. Find a Primary Care Provider  Learn more about [facility] in-office and virtual care options: [facility]

## 2021-12-23 ENCOUNTER — Ambulatory Visit
Admission: EM | Admit: 2021-12-23 | Discharge: 2021-12-23 | Disposition: A | Discharge status: Home / Self Care | Payer: 59 | Attending: Nurse Practitioner | Admitting: Nurse Practitioner

## 2021-12-23 DIAGNOSIS — L232 Allergic contact dermatitis due to cosmetics: Secondary | ICD-10-CM

## 2021-12-23 MED ORDER — DEXAMETHASONE SODIUM PHOSPHATE 10 MG/ML IJ SOLN
10.0000 mg | Freq: Once | INTRAMUSCULAR | Status: AC
  Administered 2021-12-23: 10 mg via INTRAMUSCULAR

## 2021-12-23 NOTE — ED Triage Notes (Signed)
Patient presents to [facility] for facial rash since Friday. States she had her eyebrows tinted and a couple hrs after developed a rash around eyes. Right eye is worse. Taking benadryl with some improvement. Had a virtual visit yesterday and was prescribed kenalog with no changes.

## 2021-12-23 NOTE — Discharge Instructions (Signed)
We gave you a steroid shot today to help with the irritation on your skin.  This will stay in your system for the next few days and should help with the irritation. Continue taking Claritin and you can also take Benadryl 25-50 mg at night time as needed for itching.

## 2021-12-23 NOTE — ED Provider Notes (Signed)
[facility]    CSN: [id number] Arrival date & time: 12/23/21  1312      History   Chief Complaint Chief Complaint  Patient presents with   Rash    HPI Diana [name] is a 36 y.o. female.   Patient presents for 3 days of skin irritation on her eyebrows after having them tinted.  Reports there is a rash around her eyebrows is itchy, and now there is swelling around her right eye.  She denies any drainage or oozing.  No recent fevers or nausea/vomiting.  No throat or tongue swelling, difficulty breathing.  Has been taking oral antihistamine and started prescription topical steroid cream without relief.  Reports this morning when she woke up, swelling had worsened around her eye.       Past Medical History:  Diagnosis Date   Asthma    Hypertension    Urticaria     Patient Active Problem List   Diagnosis Date Noted   Iron deficiency anemia due to chronic blood loss 09/05/2021   Right thyroid nodule 01/10/2020   Asthma 11/04/2019   Subclinical hyperthyroidism 02/11/2019   Allergic rhinitis 08/16/2018   Essential hypertension 07/17/2018   Insomnia 07/17/2018   Situational mixed anxiety and depressive disorder 07/17/2018    History reviewed. No pertinent surgical history.  OB History   No obstetric history on file.      Home Medications    Prior to Admission medications   Medication Sig Start Date End Date Taking? Authorizing Provider  albuterol (VENTOLIN HFA) 108 (90 Base) MCG/ACT inhaler INHALE 2 PUFFS INTO THE LUNGS EVERY 4 HOURS AS NEEDED FOR WHEEZE OR FOR SHORTNESS OF BREATH 04/11/21   [employer], Alvino Chapel, FNP  amLODipine (NORVASC) 5 MG tablet Take 1 tablet (5 mg total) by mouth daily. 07/16/21   [employer], Alvino Chapel, FNP  Cholecalciferol (VITAMIN D3) 50 MCG (2000 UT) capsule TAKE 1 CAPSULE BY MOUTH EVERY DAY 06/12/21   [employer], Alvino Chapel, FNP  Fluticasone Propionate (XHANCE) 93 MCG/ACT EXHU Place 1 spray into the nose in the morning and at bedtime. 06/17/21  07/17/21  Alfonse Spruce, MD  Fluticasone-Salmeterol,sensor, [facility]) [phone] MCG/ACT AEPB Inhale into the lungs.    [provider]  ibuprofen (ADVIL) 800 MG tablet Take 800 mg by mouth 3 (three) times daily. 02/19/21   [provider]  Iron, Ferrous Sulfate, 325 (65 Fe) MG TABS Take 325 mg by mouth daily. 05/02/21   [employer], Alvino Chapel, FNP  KLOR-CON M20 20 MEQ tablet TAKE 1/2 TABLETS (10 MEQ TOTAL) BY MOUTH DAILY. 09/02/21   [employer], Alvino Chapel, FNP  lisinopril-hydrochlorothiazide (ZESTORETIC) 20-25 MG tablet Take 1 tablet by mouth daily. 04/11/21   [employer], Alvino Chapel, FNP  metoprolol tartrate (LOPRESSOR) 50 MG tablet TAKE 1 TABLET BY MOUTH EVERY DAY 09/16/21   [employer], Alvino Chapel, FNP  Norethindrone Acetate-Ethinyl Estradiol (LOESTRIN) 1.5-30 MG-MCG tablet Take 1 tablet by mouth daily. 08/23/21   Campbell Riches, NP  Prenatal Vit-Fe Fumarate-FA (PRENATAL VITAMINS) 28-0.8 MG TABS 1 tablet daily 07/16/18   Salley Scarlet, MD  triamcinolone ointment (KENALOG) 0.5 % Apply 1 Application topically 2 (two) times daily. 12/22/21   Junie Spencer, FNP    Family History Family History  Problem Relation Age of Onset   Hypertension Mother    Hypertension Father    Asthma Sister    Allergic rhinitis Paternal Grandmother     Social History Social History   Tobacco Use   Smoking status: Never  Smokeless tobacco: Never  Vaping Use   Vaping Use: Never used  Substance Use Topics   Alcohol use: No   Drug use: No     Allergies   Dog epithelium allergy skin test, Gramineae pollens, Pseudoephedrine, and Amoxicillin   Review of Systems Review of Systems Per HPI  Physical Exam Triage Vital Signs ED Triage Vitals  Enc Vitals Group     BP 12/23/21 1521 (!) 157/123     Pulse Rate 12/23/21 1521 87     Resp 12/23/21 1521 18     Temp 12/23/21 1521 98 F (36.7 C)     Temp Source 12/23/21 1521 Oral     SpO2 12/23/21 1521 96 %     Weight --      Height --       Head Circumference --      Peak Flow --      Pain Score 12/23/21 1520 0     Pain Loc --      Pain Edu? --      Excl. in GC? --    No data found.  Updated Vital Signs BP (!) 157/123 (BP Location: Right Arm)   Pulse 87   Temp 98 F (36.7 C) (Oral)   Resp 18   LMP 12/09/2021 (Exact Date)   SpO2 96%   Visual Acuity Right Eye Distance:   Left Eye Distance:   Bilateral Distance:    Right Eye Near:   Left Eye Near:    Bilateral Near:     Physical Exam Vitals and nursing note reviewed.  Constitutional:      General: She is not in acute distress.    Appearance: Normal appearance. She is not toxic-appearing.  HENT:     Mouth/Throat:     Mouth: Mucous membranes are moist.     Pharynx: Oropharynx is clear.  Eyes:     General: Vision grossly intact. No scleral icterus.       Right eye: No discharge.        Left eye: No discharge.     Extraocular Movements: Extraocular movements intact.     Right eye: Normal extraocular motion.     Left eye: Normal extraocular motion.     Conjunctiva/sclera: Conjunctivae normal.     Right eye: Right conjunctiva is not injected. No chemosis or exudate.    Left eye: Left conjunctiva is not injected. No chemosis or exudate.    Comments: Mild right periorbital edema without erythema, fluctuance, tenderness to palpation.  Extraocular movements full bilaterally.  Mild erythema surrounding eyebrows bilaterally.  No active drainage, oozing, fluctuance, warmth.  Pulmonary:     Effort: Pulmonary effort is normal. No respiratory distress.  Skin:    General: Skin is warm and dry.     Capillary Refill: Capillary refill takes less than 2 seconds.     Coloration: Skin is not jaundiced or pale.     Findings: No erythema.  Neurological:     Mental Status: She is alert and oriented to person, place, and time.  Psychiatric:        Behavior: Behavior is cooperative.      UC Treatments / Results  Labs (all labs ordered are listed, but only abnormal  results are displayed) Labs Reviewed - No data to display  EKG   Radiology No results found.  Procedures Procedures (including critical care time)  Medications Ordered in UC Medications  dexamethasone (DECADRON) injection 10 mg (10 mg Intramuscular Given 12/23/21 1553)  Initial Impression / Assessment and Plan / UC Course  I have reviewed the triage vital signs and the nursing notes.  Pertinent labs & imaging results that were available during my care of the patient were reviewed by me and considered in my medical decision making (see chart for details).   Patient is well-appearing, afebrile, not tachycardic, not tachypneic, oxygenating well on room air.  Patient is hypertensive in urgent care today, likely secondary to acute illness.  She denies chest pain, shortness of breath, vision changes, headache, dizziness/lightheadedness.  Recommended monitoring blood pressure at home and follow-up with PCP with no improvement.  Allergic contact dermatitis due to cosmetics Treat with dexamethasone IM today in urgent care Continue oral antihistamine Supportive care discussed  The patient was given the opportunity to ask questions.  All questions answered to their satisfaction.  The patient is in agreement to this plan.    Final Clinical Impressions(s) / UC Diagnoses   Final diagnoses:  Allergic contact dermatitis due to cosmetics     Discharge Instructions      We gave you a steroid shot today to help with the irritation on your skin.  This will stay in your system for the next few days and should help with the irritation. Continue taking Claritin and you can also take Benadryl 25-50 mg at night time as needed for itching.     ED Prescriptions   None    PDMP not reviewed this encounter.   Valentino Nose, NP 12/23/21 [phone]

## 2021-12-24 ENCOUNTER — Ambulatory Visit: Payer: 59

## 2022-01-05 ENCOUNTER — Other Ambulatory Visit: Payer: Self-pay | Admitting: Nurse Practitioner

## 2022-01-05 DIAGNOSIS — I10 Essential (primary) hypertension: Secondary | ICD-10-CM

## 2022-01-15 ENCOUNTER — Encounter: Payer: Self-pay | Admitting: Family Medicine

## 2022-01-15 ENCOUNTER — Ambulatory Visit: Payer: 59 | Admitting: Nurse Practitioner

## 2022-01-15 ENCOUNTER — Ambulatory Visit (INDEPENDENT_AMBULATORY_CARE_PROVIDER_SITE_OTHER): Payer: 59 | Admitting: Family Medicine

## 2022-01-15 VITALS — BP 138/92 | HR 76 | Temp 97.5°F | Ht 62.0 in | Wt 153.0 lb

## 2022-01-15 DIAGNOSIS — D500 Iron deficiency anemia secondary to blood loss (chronic): Secondary | ICD-10-CM

## 2022-01-15 DIAGNOSIS — I10 Essential (primary) hypertension: Secondary | ICD-10-CM

## 2022-01-15 MED ORDER — POTASSIUM CHLORIDE CRYS ER 20 MEQ PO TBCR: EXTENDED_RELEASE_TABLET | ORAL | 1 refills | Status: DC

## 2022-01-15 MED ORDER — HYDROCHLOROTHIAZIDE 25 MG PO TABS: 25.0000 mg | ORAL_TABLET | Freq: Every day | ORAL | 3 refills | Status: DC

## 2022-01-15 MED ORDER — AMLODIPINE BESYLATE 10 MG PO TABS: 10.0000 mg | ORAL_TABLET | Freq: Every day | ORAL | 1 refills | Status: DC

## 2022-01-15 MED ORDER — LABETALOL HCL 100 MG PO TABS: 100.0000 mg | ORAL_TABLET | Freq: Two times a day (BID) | ORAL | 1 refills | Status: DC

## 2022-01-15 NOTE — Patient Instructions (Addendum)
[facility] - Dr. Langston Masker  [phone]  Follow up in 1 month for BP check.  Medications as prescribed.  Take care  Dr. Adriana Simas

## 2022-01-16 NOTE — Assessment & Plan Note (Signed)
Following with heme/onc

## 2022-01-16 NOTE — Progress Notes (Signed)
Subjective:  Patient ID: Diana [name], female    DOB: December 07, 1985  Age: 36 y.o. MRN: [id number]  CC: Chief Complaint  Patient presents with   Hypertension    Did not take metoprolol today   referral to gyn doctor needed    For pap    HPI:  36 year old female with hypertension, asthma, and iron deficiency anemia due to heavy menstrual bleeding presents for follow-up.  Patient is interested in seeing a new OB/GYN.  She had a miscarriage earlier in the year.  She is not actively trying to get pregnant but is not on birth control.  Patient's hypertension is not at goal.  Also, patient is on lisinopril which needs to be avoided in individuals who may become pregnant or are not on birth control as it is the case for her.  She is currently on lisinopril/HCTZ, amlodipine, and metoprolol.  We will discuss today.  Patient Active Problem List   Diagnosis Date Noted   Iron deficiency anemia due to chronic blood loss 09/05/2021   Right thyroid nodule 01/10/2020   Asthma 11/04/2019   Allergic rhinitis 08/16/2018   Essential hypertension 07/17/2018    Social Hx   Social History   Socioeconomic History   Marital status: Single    Spouse name: Not on file   Number of children: Not on file   Years of education: Not on file   Highest education level: Not on file  Occupational History   Not on file  Tobacco Use   Smoking status: Never   Smokeless tobacco: Never  Vaping Use   Vaping Use: Never used  Substance and Sexual Activity   Alcohol use: No   Drug use: No   Sexual activity: Not on file  Other Topics Concern   Not on file  Social History Narrative   Not on file   Social Determinants of Health   Financial Resource Strain: Not on file  Food Insecurity: Not on file  Transportation Needs: Not on file  Physical Activity: Not on file  Stress: Not on file  Social Connections: Not on file    Review of Systems  Respiratory: Negative.    Cardiovascular: Negative.     Objective:  BP (!) 138/92   Pulse 76   Temp (!) 97.5 F (36.4 C)   Ht 5\' 2"  (1.575 m)   Wt 153 lb (69.4 kg)   LMP 01/03/2022 (Exact Date)   SpO2 95%   BMI 27.98 kg/m      01/15/2022    9:54 AM 12/23/2021    3:21 PM 11/30/2021   12:41 PM  BP/Weight  Systolic BP 138 157 143  Diastolic BP 92 123 87  Wt. (Lbs) 153    BMI 27.98 kg/m2      Physical Exam Vitals and nursing note reviewed.  Constitutional:      General: She is not in acute distress.    Appearance: Normal appearance.  HENT:     Head: Normocephalic and atraumatic.  Eyes:     General:        Right eye: No discharge.        Left eye: No discharge.     Conjunctiva/sclera: Conjunctivae normal.  Cardiovascular:     Rate and Rhythm: Normal rate and regular rhythm.  Pulmonary:     Effort: Pulmonary effort is normal.     Breath sounds: Normal breath sounds.  Neurological:     Mental Status: She is alert.  Psychiatric:  Mood and Affect: Mood normal.        Behavior: Behavior normal.     Lab Results  Component Value Date   WBC 7.4 11/22/2021   HGB 10.9 (L) 11/22/2021   HCT 34.8 (L) 11/22/2021   PLT 395 11/22/2021   GLUCOSE 98 09/16/2021   CHOL 185 04/11/2021   TRIG 96 04/11/2021   HDL 55 04/11/2021   LDLCALC 113 (H) 04/11/2021   ALT 18 05/17/2021   AST 14 05/17/2021   NA 136 09/16/2021   K 3.4 (L) 09/16/2021   CL 104 09/16/2021   CREATININE 0.61 09/16/2021   BUN 8 09/16/2021   CO2 24 09/16/2021   TSH 0.815 04/11/2021   HGBA1C 5.3 06/22/2020     Assessment & Plan:   Problem List Items Addressed This Visit       Cardiovascular and Mediastinum   Essential hypertension    BP uncontrolled.  Medications are not optimized in the event that she would become pregnant.  Stopping lisinopril.  Continuing HCTZ and amlodipine.  Stopping metoprolol.  Starting labetalol.        Relevant Medications   labetalol (NORMODYNE) 100 MG tablet   amLODipine (NORVASC) 10 MG tablet   potassium chloride  SA (KLOR-CON M20) 20 MEQ tablet   hydrochlorothiazide (HYDRODIURIL) 25 MG tablet     Other   Iron deficiency anemia due to chronic blood loss - Primary    Following with heme-onc.       Meds ordered this encounter  Medications   labetalol (NORMODYNE) 100 MG tablet    Sig: Take 1 tablet (100 mg total) by mouth 2 (two) times daily.    Dispense:  180 tablet    Refill:  1   amLODipine (NORVASC) 10 MG tablet    Sig: Take 1 tablet (10 mg total) by mouth daily.    Dispense:  90 tablet    Refill:  1   potassium chloride SA (KLOR-CON M20) 20 MEQ tablet    Sig: TAKE 1/2 TABLETS (10 MEQ TOTAL) BY MOUTH DAILY.    Dispense:  45 tablet    Refill:  1   hydrochlorothiazide (HYDRODIURIL) 25 MG tablet    Sig: Take 1 tablet (25 mg total) by mouth daily.    Dispense:  90 tablet    Refill:  3    Follow-up:  Return in about 1 month (around 02/15/2022).  Everlene Other DO [facility]

## 2022-01-16 NOTE — Assessment & Plan Note (Signed)
BP uncontrolled.  Medications are not optimized in the event that she would become pregnant.  Stopping lisinopril.  Continuing HCTZ and amlodipine.  Stopping metoprolol.  Starting labetalol.

## 2022-01-23 ENCOUNTER — Encounter: Payer: Self-pay | Admitting: Family Medicine

## 2022-02-04 ENCOUNTER — Encounter: Payer: Self-pay | Admitting: Family Medicine

## 2022-02-04 ENCOUNTER — Ambulatory Visit (INDEPENDENT_AMBULATORY_CARE_PROVIDER_SITE_OTHER): Payer: 59 | Admitting: Family Medicine

## 2022-02-04 VITALS — BP 130/60 | HR 91 | Temp 98.1°F | Wt 148.8 lb

## 2022-02-04 DIAGNOSIS — F419 Anxiety disorder, unspecified: Secondary | ICD-10-CM | POA: Diagnosis not present

## 2022-02-04 DIAGNOSIS — F32A Depression, unspecified: Secondary | ICD-10-CM | POA: Diagnosis not present

## 2022-02-04 MED ORDER — SERTRALINE HCL 50 MG PO TABS: 50.0000 mg | ORAL_TABLET | Freq: Every day | ORAL | 3 refills | Status: DC

## 2022-02-04 NOTE — Assessment & Plan Note (Signed)
Referral placed to psychology for counseling.  Starting Zoloft.  Follow-up in 6 weeks.

## 2022-02-04 NOTE — Progress Notes (Signed)
Subjective:  Patient ID: Diana [name], female    DOB: January 24, 1986  Age: 37 y.o. MRN: [id number]  CC: Chief Complaint  Patient presents with   Anxiety    Pt reports anxiety/depression. Pt states she has had some issues with anxiety/depression but not this severe. No plans to harm self. Pt states this has been going on about 3 weeks.     HPI:  37 year old female with rhinitis, asthma, iron deficiency anemia, hypertension presents for evaluation of the above.  Having difficulty.  She states that she has had a lot of loss this year and is also recently went through a break-up.  Symptoms have been worsening over the past 3 weeks.  Little interest or pleasure in doing things.  Feeling down.  Feeling anxious and having trouble focusing.  She states that her mind is racing.  PHQ-9 score of 19.  GAD-7 score of 15.  Patient Active Problem List   Diagnosis Date Noted   Anxiety and depression 02/04/2022   Iron deficiency anemia due to chronic blood loss 09/05/2021   Right thyroid nodule 01/10/2020   Asthma 11/04/2019   Allergic rhinitis 08/16/2018   Essential hypertension 07/17/2018    Social Hx   Social History   Socioeconomic History   Marital status: Single    Spouse name: Not on file   Number of children: Not on file   Years of education: Not on file   Highest education level: Not on file  Occupational History   Not on file  Tobacco Use   Smoking status: Never   Smokeless tobacco: Never  Vaping Use   Vaping Use: Never used  Substance and Sexual Activity   Alcohol use: No   Drug use: No   Sexual activity: Not on file  Other Topics Concern   Not on file  Social History Narrative   Not on file   Social Determinants of Health   Financial Resource Strain: Not on file  Food Insecurity: Not on file  Transportation Needs: Not on file  Physical Activity: Not on file  Stress: Not on file  Social Connections: Not on file    Review of Systems Per HPI  Objective:  BP  130/60   Pulse 91   Temp 98.1 F (36.7 C)   Wt 148 lb 12.8 oz (67.5 kg)   LMP 01/03/2022 (Exact Date)   SpO2 97%   BMI 27.22 kg/m      02/04/2022   10:31 AM 01/15/2022    9:54 AM 12/23/2021    3:21 PM  BP/Weight  Systolic BP 076 226 333  Diastolic BP 60 92 545  Wt. (Lbs) 148.8 153   BMI 27.22 kg/m2 27.98 kg/m2     Physical Exam Nursing note reviewed.  Constitutional:      General: She is not in acute distress.    Appearance: Normal appearance.  Cardiovascular:     Rate and Rhythm: Normal rate and regular rhythm.  Pulmonary:     Effort: Pulmonary effort is normal.     Breath sounds: Normal breath sounds. No wheezing, rhonchi or rales.  Neurological:     Mental Status: She is alert.  Psychiatric:     Comments: Flat affect.  Depressed mood.     Lab Results  Component Value Date   WBC 7.4 11/22/2021   HGB 10.9 (L) 11/22/2021   HCT 34.8 (L) 11/22/2021   PLT 395 11/22/2021   GLUCOSE 98 09/16/2021   CHOL 185 04/11/2021   TRIG 96 04/11/2021  HDL 55 04/11/2021   LDLCALC 113 (H) 04/11/2021   ALT 18 05/17/2021   AST 14 05/17/2021   NA 136 09/16/2021   K 3.4 (L) 09/16/2021   CL 104 09/16/2021   CREATININE 0.61 09/16/2021   BUN 8 09/16/2021   CO2 24 09/16/2021   TSH 0.815 04/11/2021   HGBA1C 5.3 06/22/2020     Assessment & Plan:   Problem List Items Addressed This Visit       Other   Anxiety and depression - Primary    Referral placed to psychology for counseling.  Starting Zoloft.  Follow-up in 6 weeks.      Relevant Medications   sertraline (ZOLOFT) 50 MG tablet   Other Relevant Orders   Ambulatory referral to Psychology    Meds ordered this encounter  Medications   sertraline (ZOLOFT) 50 MG tablet    Sig: Take 1 tablet (50 mg total) by mouth daily.    Dispense:  30 tablet    Refill:  3    Follow-up:  Return in about 6 weeks (around 03/18/2022).  Johnson

## 2022-02-04 NOTE — Patient Instructions (Addendum)
I have placed referral regarding counseling.  Medication as prescribed.  Follow up in 6 weeks (this can be virtual if you like).

## 2022-02-18 ENCOUNTER — Ambulatory Visit: Payer: 59 | Admitting: Family Medicine

## 2022-02-24 ENCOUNTER — Other Ambulatory Visit: Payer: Self-pay

## 2022-02-24 DIAGNOSIS — D500 Iron deficiency anemia secondary to blood loss (chronic): Secondary | ICD-10-CM

## 2022-02-25 ENCOUNTER — Inpatient Hospital Stay: Payer: 59 | Attending: Hematology

## 2022-02-25 DIAGNOSIS — N920 Excessive and frequent menstruation with regular cycle: Secondary | ICD-10-CM | POA: Insufficient documentation

## 2022-02-25 DIAGNOSIS — D509 Iron deficiency anemia, unspecified: Secondary | ICD-10-CM | POA: Diagnosis not present

## 2022-02-25 DIAGNOSIS — Z79899 Other long term (current) drug therapy: Secondary | ICD-10-CM | POA: Insufficient documentation

## 2022-02-25 DIAGNOSIS — D500 Iron deficiency anemia secondary to blood loss (chronic): Secondary | ICD-10-CM

## 2022-02-25 LAB — CBC WITH DIFFERENTIAL/PLATELET
Abs Immature Granulocytes: 0.01 10*3/uL (ref 0.00–0.07)
Basophils Absolute: 0 10*3/uL (ref 0.0–0.1)
Basophils Relative: 1 %
Eosinophils Absolute: 0.2 10*3/uL (ref 0.0–0.5)
Eosinophils Relative: 3 %
HCT: 34.2 % — ABNORMAL LOW (ref 36.0–46.0)
Hemoglobin: 10.7 g/dL — ABNORMAL LOW (ref 12.0–15.0)
Immature Granulocytes: 0 %
Lymphocytes Relative: 41 %
Lymphs Abs: 2.5 10*3/uL (ref 0.7–4.0)
MCH: 25 pg — ABNORMAL LOW (ref 26.0–34.0)
MCHC: 31.3 g/dL (ref 30.0–36.0)
MCV: 79.9 fL — ABNORMAL LOW (ref 80.0–100.0)
Monocytes Absolute: 0.5 10*3/uL (ref 0.1–1.0)
Monocytes Relative: 8 %
Neutro Abs: 3 10*3/uL (ref 1.7–7.7)
Neutrophils Relative %: 47 %
Platelets: 390 10*3/uL (ref 150–400)
RBC: 4.28 MIL/uL (ref 3.87–5.11)
RDW: 15.8 % — ABNORMAL HIGH (ref 11.5–15.5)
WBC: 6.2 10*3/uL (ref 4.0–10.5)
nRBC: 0 % (ref 0.0–0.2)

## 2022-02-25 LAB — IRON AND TIBC
Iron: 31 ug/dL (ref 28–170)
Saturation Ratios: 9 % — ABNORMAL LOW (ref 10.4–31.8)
TIBC: 362 ug/dL (ref 250–450)
UIBC: 331 ug/dL

## 2022-02-25 LAB — FERRITIN: Ferritin: 19 ng/mL (ref 11–307)

## 2022-02-26 ENCOUNTER — Other Ambulatory Visit: Payer: Self-pay | Admitting: Family Medicine

## 2022-03-04 ENCOUNTER — Inpatient Hospital Stay (HOSPITAL_BASED_OUTPATIENT_CLINIC_OR_DEPARTMENT_OTHER): Payer: 59 | Admitting: Physician Assistant

## 2022-03-04 ENCOUNTER — Other Ambulatory Visit: Payer: Self-pay

## 2022-03-04 VITALS — BP 140/84 | HR 74 | Temp 98.0°F | Resp 18 | Ht 62.0 in | Wt 144.0 lb

## 2022-03-04 DIAGNOSIS — D509 Iron deficiency anemia, unspecified: Secondary | ICD-10-CM | POA: Diagnosis not present

## 2022-03-04 DIAGNOSIS — D500 Iron deficiency anemia secondary to blood loss (chronic): Secondary | ICD-10-CM

## 2022-03-04 NOTE — Progress Notes (Signed)
[place], [place] [address]   CLINIC:  Medical Oncology/Hematology  PCP:  Coral Spikes, DO [place] [address] [phone]   REASON FOR VISIT:  Follow-up for iron deficiency anemia  CURRENT THERAPY: Oral iron supplementation + IV iron as needed (Venofer x 3 from 09/13/2021 - 09/27/2021)  INTERVAL HISTORY:   Diana [name] 37 y.o. female returns for routine follow-up of her iron deficiency anemia.  She was last seen via virtual visit by Dede Query PA-C on 11/25/2021.  At today's visit, she reports feeling fair.  No recent hospitalizations, surgeries, or changes in baseline health status.  She had some improved energy after her iron infusions in August 2023, but has had recurrent fatigue since December 2023. She continues to deal with heavy menstrual bleeding, and has been referred to gynecology with upcoming appointment in March 2024.  She denies any rectal bleeding or melena.  She reports headaches, lightheadedness, and ice pica.  She denies any chest pain, dyspnea on exertion, or syncope.  She continues to take iron as prescribed without any major side effects.  She has 35% energy and 90% appetite. She endorses that she is maintaining a stable weight.   ASSESSMENT & PLAN:  1.  Iron deficiency anemia from chronic menstrual blood loss - She reports heavy menses and also had miscarriage earlier this year. - No rectal bleeding or melena. - She has been referred to gynecology due to menorrhagia.   - Failed to improve on oral iron supplementation alone. - Received IV venofer 300 mg x 3 doses from 09/13/2021-09/27/2021 - Most recent labs (02/25/2022): Hgb 10.7/MCV 79.9, ferritin 19, iron saturation 9% - Symptomatic with fatigue, pica, headaches - PLAN: Recommend additional IV iron supplementation with Venofer 300 mg x 3. - Continue daily ferrous sulfate 325 mg with vitamin C - Repeat labs and RTC in 3 months  - If she has  persistent microcytic anemia despite normalization of iron, we will check hemoglobin electrophoresis (patient's mother has thalassemia trait)  2.  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  Non-smoker. - Mother has thalassemia trait.  No cancers in the family.  PLAN SUMMARY: >> Venofer 300 mg x 3 >> Same-day labs (CBC/D, ferritin, iron/TIBC) + OFFICE visit in 3 months     REVIEW OF SYSTEMS:   Review of Systems  Constitutional:  Positive for fatigue. Negative for appetite change, chills, diaphoresis, fever and unexpected weight change.  HENT:   Negative for lump/mass and nosebleeds.   Eyes:  Negative for eye problems.  Respiratory:  Negative for cough, hemoptysis and shortness of breath.   Cardiovascular:  Negative for chest pain, leg swelling and palpitations.  Gastrointestinal:  Negative for abdominal pain, blood in stool, constipation, diarrhea, nausea and vomiting.  Genitourinary:  Positive for vaginal bleeding. Negative for hematuria.   Skin: Negative.   Neurological:  Positive for headaches and light-headedness. Negative for dizziness.  Hematological:  Does not bruise/bleed easily.  Psychiatric/Behavioral:  Positive for sleep disturbance. The patient is nervous/anxious.      PHYSICAL EXAM:  ECOG PERFORMANCE STATUS: 1 - Symptomatic but completely ambulatory  There were no vitals filed for this visit. There were no vitals filed for this visit. Physical Exam Constitutional:      Appearance: Normal appearance.  HENT:     Head: Normocephalic and atraumatic.     Mouth/Throat:     Mouth: Mucous membranes are  moist.  Eyes:     Extraocular Movements: Extraocular movements intact.     Pupils: Pupils are equal, round, and reactive to light.  Cardiovascular:     Rate and Rhythm: Normal rate and regular rhythm.     Pulses: Normal pulses.     Heart sounds: Normal heart sounds.  Pulmonary:     Effort: Pulmonary effort is normal.      Breath sounds: Normal breath sounds.  Abdominal:     General: Bowel sounds are normal.     Palpations: Abdomen is soft.     Tenderness: There is no abdominal tenderness.  Musculoskeletal:        General: No swelling.     Right lower leg: No edema.     Left lower leg: No edema.  Lymphadenopathy:     Cervical: No cervical adenopathy.  Skin:    General: Skin is warm and dry.  Neurological:     General: No focal deficit present.     Mental Status: She is alert and oriented to person, place, and time.  Psychiatric:        Mood and Affect: Mood normal.        Behavior: Behavior normal.     PAST MEDICAL/SURGICAL HISTORY:  Past Medical History:  Diagnosis Date   Asthma    Hypertension    Urticaria    No past surgical history on file.  SOCIAL HISTORY:  Social History   Socioeconomic History   Marital status: Single    Spouse name: Not on file   Number of children: Not on file   Years of education: Not on file   Highest education level: Not on file  Occupational History   Not on file  Tobacco Use   Smoking status: Never   Smokeless tobacco: Never  Vaping Use   Vaping Use: Never used  Substance and Sexual Activity   Alcohol use: No   Drug use: No   Sexual activity: Not on file  Other Topics Concern   Not on file  Social History Narrative   Not on file   Social Determinants of Health   Financial Resource Strain: Not on file  Food Insecurity: Not on file  Transportation Needs: Not on file  Physical Activity: Not on file  Stress: Not on file  Social Connections: Not on file  Intimate Partner Violence: Not on file    FAMILY HISTORY:  Family History  Problem Relation Age of Onset   Hypertension Mother    Hypertension Father    Asthma Sister    Allergic rhinitis Paternal Grandmother     CURRENT MEDICATIONS:  Outpatient Encounter Medications as of 03/04/2022  Medication Sig   albuterol (VENTOLIN HFA) 108 (90 Base) MCG/ACT inhaler INHALE 2 PUFFS INTO  THE LUNGS EVERY 4 HOURS AS NEEDED FOR WHEEZE OR FOR SHORTNESS OF BREATH   amLODipine (NORVASC) 10 MG tablet Take 1 tablet (10 mg total) by mouth daily.   Cholecalciferol (VITAMIN D3) 50 MCG (2000 UT) capsule TAKE 1 CAPSULE BY MOUTH EVERY DAY   Fluticasone Propionate (XHANCE) 93 MCG/ACT EXHU Place 1 spray into the nose in the morning and at bedtime.   hydrochlorothiazide (HYDRODIURIL) 25 MG tablet Take 1 tablet (25 mg total) by mouth daily.   Iron, Ferrous Sulfate, 325 (65 Fe) MG TABS Take 325 mg by mouth daily.   labetalol (NORMODYNE) 100 MG tablet Take 1 tablet (100 mg total) by mouth 2 (two) times daily.   potassium chloride SA (KLOR-CON M20) 20  MEQ tablet TAKE 1/2 TABLETS (10 MEQ TOTAL) BY MOUTH DAILY.   Prenatal Vit-Fe Fumarate-FA (PRENATAL VITAMINS) 28-0.8 MG TABS 1 tablet daily   sertraline (ZOLOFT) 50 MG tablet TAKE 1 TABLET BY MOUTH EVERY DAY   No facility-administered encounter medications on file as of 03/04/2022.    ALLERGIES:  Allergies  Allergen Reactions   Dog Epithelium Allergy Skin Test Itching   Gramineae Pollens Rash   Pseudoephedrine Palpitations    Raises BP also   Amoxicillin Itching and Rash    LABORATORY DATA:  I have reviewed the labs as listed.  CBC    Component Value Date/Time   WBC 6.2 02/25/2022 1340   RBC 4.28 02/25/2022 1340   HGB 10.7 (L) 02/25/2022 1340   HGB 10.3 (L) 07/17/2021 1456   HCT 34.2 (L) 02/25/2022 1340   HCT 31.9 (L) 07/17/2021 1456   PLT 390 02/25/2022 1340   PLT 481 (H) 07/17/2021 1456   MCV 79.9 (L) 02/25/2022 1340   MCV 71 (L) 07/17/2021 1456   MCH 25.0 (L) 02/25/2022 1340   MCHC 31.3 02/25/2022 1340   RDW 15.8 (H) 02/25/2022 1340   RDW 16.0 (H) 07/17/2021 1456   LYMPHSABS 2.5 02/25/2022 1340   LYMPHSABS 2.7 07/17/2021 1456   MONOABS 0.5 02/25/2022 1340   EOSABS 0.2 02/25/2022 1340   EOSABS 0.2 07/17/2021 1456   BASOSABS 0.0 02/25/2022 1340   BASOSABS 0.0 07/17/2021 1456      Latest Ref Rng & Units 09/16/2021   12:03  PM 05/17/2021    1:03 PM 05/02/2021   10:45 AM  CMP  Glucose 70 - 99 mg/dL 98  91  105   BUN 6 - 20 mg/dL 8  15  14    Creatinine 0.44 - 1.00 mg/dL 0.61  0.75  0.72   Sodium 135 - 145 mmol/L 136  144  139   Potassium 3.5 - 5.1 mmol/L 3.4  4.3  3.5   Chloride 98 - 111 mmol/L 104  105  101   CO2 22 - 32 mmol/L 24  23  24    Calcium 8.9 - 10.3 mg/dL 8.7  10.0  9.5   Total Protein 6.0 - 8.5 g/dL  7.6  6.9   Total Bilirubin 0.0 - 1.2 mg/dL  0.2  <0.2   Alkaline Phos 44 - 121 IU/L  86  69   AST 0 - 40 IU/L  14  29   ALT 0 - 32 IU/L  18  114     DIAGNOSTIC IMAGING:  I have independently reviewed the relevant imaging and discussed with the patient.   WRAP UP:  All questions were answered. The patient knows to call the clinic with any problems, questions or concerns.  Medical decision making: Low  Time spent on visit: I spent 15 minutes counseling the patient face to face. The total time spent in the appointment was 22 minutes and more than 50% was on counseling.  Harriett Rush, PA-C  03/04/2022 2:40 PM

## 2022-03-04 NOTE — Patient Instructions (Signed)
[facility] [facility] [place] **   You were seen today by Tarri Abernethy PA-C for your iron deficiency anemia.    IRON DEFICIENCY ANEMIA Your blood and iron levels remain low.  This is likely due to your ongoing menstrual bleeding. Continue to take iron tablet daily.  This is best taken in the morning along with a source of vitamin C or a glass of orange juice.  Take with food to minimize stomach side effects.  You can take an over-the-counter stool softener if your iron tablet causes constipation.  If you take any acid reflux medication, make sure that you take your iron at a different time of day than your acid medication (separated by at least 2 hours). We will also schedule you for IV iron x 3 doses. It is also possible that you may have some thalassemia trait which is contributing to your anemia, although this is difficult to test for why you also have low iron.  Once your iron stores are back to normal, we will consider testing for thalassemia trait.  TREATMENT PLAN: - IV iron x 3 doses - Continue daily iron supplement  FOLLOW-UP APPOINTMENT: Same-day labs and office visit in 3 months  ** Thank you for trusting me with your healthcare!  I strive to provide all of my patients with quality care at each visit.  If you receive a survey for this visit, I would be so grateful to you for taking the time to provide feedback.  Thank you in advance!  ~ Pamela Maddy                   Dr. Derek Jack   &   Tarri Abernethy, PA-C   - - - - - - - - - - - - - - - - - -    Thank you for choosing [facility] at [facility] to provide your oncology and hematology care.  To afford each patient quality time with our provider, please arrive at least 15 minutes before your scheduled appointment time.   If you have a lab appointment with the [facility] please come in thru the Main Entrance and check in at the main  information desk.  You need to re-schedule your appointment should you arrive 10 or more minutes late.  We strive to give you quality time with our providers, and arriving late affects you and other patients whose appointments are after yours.  Also, if you no show three or more times for appointments you may be dismissed from the clinic at the providers discretion.     Again, thank you for choosing [facility].  Our hope is that these requests will decrease the amount of time that you wait before being seen by our physicians.       _____________________________________________________________  Should you have questions after your visit to [facility], please contact our office at [phone] and follow the prompts.  Our office hours are 8:00 a.m. and 4:30 p.m. Monday - Friday.  Please note that voicemails left after 4:00 p.m. may not be returned until the following business day.  We are closed weekends and major holidays.  You do have access to a nurse 24-7, just call the main number to the clinic [phone] and do not press any options, hold on the line and a nurse will answer the phone.    For prescription refill  requests, have your pharmacy contact our office and allow 72 hours.

## 2022-03-05 ENCOUNTER — Encounter: Payer: Self-pay | Admitting: Family Medicine

## 2022-03-05 ENCOUNTER — Ambulatory Visit (INDEPENDENT_AMBULATORY_CARE_PROVIDER_SITE_OTHER): Payer: 59 | Admitting: Family Medicine

## 2022-03-05 VITALS — BP 118/81 | HR 73 | Temp 97.9°F | Ht 62.0 in | Wt 144.0 lb

## 2022-03-05 DIAGNOSIS — D509 Iron deficiency anemia, unspecified: Secondary | ICD-10-CM | POA: Diagnosis not present

## 2022-03-05 DIAGNOSIS — F419 Anxiety disorder, unspecified: Secondary | ICD-10-CM

## 2022-03-05 DIAGNOSIS — I10 Essential (primary) hypertension: Secondary | ICD-10-CM

## 2022-03-05 DIAGNOSIS — F5101 Primary insomnia: Secondary | ICD-10-CM

## 2022-03-05 DIAGNOSIS — F32A Depression, unspecified: Secondary | ICD-10-CM

## 2022-03-05 DIAGNOSIS — G4700 Insomnia, unspecified: Secondary | ICD-10-CM | POA: Insufficient documentation

## 2022-03-05 MED ORDER — TRAZODONE HCL 50 MG PO TABS: 25.0000 mg | ORAL_TABLET | Freq: Every evening | ORAL | 3 refills | Status: DC | PRN

## 2022-03-05 MED ORDER — IRON (FERROUS SULFATE) 325 (65 FE) MG PO TABS: 325.0000 mg | ORAL_TABLET | Freq: Every day | ORAL | 1 refills | Status: DC

## 2022-03-05 NOTE — Assessment & Plan Note (Signed)
Blood pressure stable on amlodipine, HCTZ, and labetalol.  Continue.

## 2022-03-05 NOTE — Progress Notes (Signed)
Subjective:  Patient ID: Diana [name], female    DOB: 07/20/1985  Age: 37 y.o. MRN: [id number]  CC: Chief Complaint  Patient presents with   anxiety and depression    HPI:  37 year old female presents for follow-up.  Anxiety and depression has improved with treatment with Zoloft.  However, she is having significant issues with insomnia.  She has trouble falling asleep.  States that she sleeps 3 to 4 hours a night.  She is tired at work during the day but then when she gets home she has difficulty sleeping despite being tired.  She states otherwise she is doing well at this time.  Blood pressure is well-controlled.  She is following with hematology/oncology regarding anemia.  Patient Active Problem List   Diagnosis Date Noted   Insomnia 03/05/2022   Anxiety and depression 02/04/2022   Iron deficiency anemia due to chronic blood loss 09/05/2021   Right thyroid nodule 01/10/2020   Asthma 11/04/2019   Allergic rhinitis 08/16/2018   Essential hypertension 07/17/2018    Social Hx   Social History   Socioeconomic History   Marital status: Single    Spouse name: Not on file   Number of children: Not on file   Years of education: Not on file   Highest education level: Not on file  Occupational History   Not on file  Tobacco Use   Smoking status: Never   Smokeless tobacco: Never  Vaping Use   Vaping Use: Never used  Substance and Sexual Activity   Alcohol use: No   Drug use: No   Sexual activity: Not on file  Other Topics Concern   Not on file  Social History Narrative   Not on file   Social Determinants of Health   Financial Resource Strain: Not on file  Food Insecurity: Not on file  Transportation Needs: Not on file  Physical Activity: Not on file  Stress: Not on file  Social Connections: Not on file    Review of Systems Per HPI  Objective:  BP 118/81   Pulse 73   Temp 97.9 F (36.6 C)   Ht 5\' 2"  (1.575 m)   Wt 144 lb (65.3 kg)   SpO2 99%   BMI  26.34 kg/m      03/05/2022   10:05 AM 03/04/2022    2:20 PM 02/04/2022   10:31 AM  BP/Weight  Systolic BP 081 448 185  Diastolic BP 81 84 60  Wt. (Lbs) 144 144 148.8  BMI 26.34 kg/m2 26.34 kg/m2 27.22 kg/m2    Physical Exam Vitals and nursing note reviewed.  Constitutional:      General: She is not in acute distress.    Appearance: Normal appearance.  Cardiovascular:     Rate and Rhythm: Normal rate and regular rhythm.  Pulmonary:     Effort: Pulmonary effort is normal.     Breath sounds: Normal breath sounds.  Neurological:     Mental Status: She is alert.  Psychiatric:        Mood and Affect: Mood normal.        Behavior: Behavior normal.     Lab Results  Component Value Date   WBC 6.2 02/25/2022   HGB 10.7 (L) 02/25/2022   HCT 34.2 (L) 02/25/2022   PLT 390 02/25/2022   GLUCOSE 98 09/16/2021   CHOL 185 04/11/2021   TRIG 96 04/11/2021   HDL 55 04/11/2021   LDLCALC 113 (H) 04/11/2021   ALT 18 05/17/2021   AST  14 05/17/2021   NA 136 09/16/2021   K 3.4 (L) 09/16/2021   CL 104 09/16/2021   CREATININE 0.61 09/16/2021   BUN 8 09/16/2021   CO2 24 09/16/2021   TSH 0.815 04/11/2021   HGBA1C 5.3 06/22/2020     Assessment & Plan:   Problem List Items Addressed This Visit       Cardiovascular and Mediastinum   Essential hypertension - Primary    Blood pressure stable on amlodipine, HCTZ, and labetalol.  Continue.        Other   Anxiety and depression    Doing well at this time.  Continue Zoloft.      Relevant Medications   traZODone (DESYREL) 50 MG tablet   Insomnia    Starting trazodone.      Other Visit Diagnoses     Iron deficiency anemia, unspecified iron deficiency anemia type       Relevant Medications   Iron, Ferrous Sulfate, 325 (65 Fe) MG TABS       Meds ordered this encounter  Medications   Iron, Ferrous Sulfate, 325 (65 Fe) MG TABS    Sig: Take 325 mg by mouth daily.    Dispense:  90 tablet    Refill:  1   traZODone (DESYREL)  50 MG tablet    Sig: Take 0.5-1 tablets (25-50 mg total) by mouth at bedtime as needed for sleep.    Dispense:  30 tablet    Refill:  3    Follow-up:  3-6 months  [place]

## 2022-03-05 NOTE — Assessment & Plan Note (Signed)
Doing well at this time.  Continue Zoloft.

## 2022-03-05 NOTE — Patient Instructions (Signed)
Medication as prescribed.  Follow up in 3-6 months. 

## 2022-03-05 NOTE — Assessment & Plan Note (Signed)
Starting trazodone. 

## 2022-03-11 ENCOUNTER — Telehealth: Payer: 59 | Admitting: Physician Assistant

## 2022-03-11 ENCOUNTER — Inpatient Hospital Stay: Payer: 59

## 2022-03-11 ENCOUNTER — Ambulatory Visit: Payer: Self-pay

## 2022-03-11 DIAGNOSIS — K2900 Acute gastritis without bleeding: Secondary | ICD-10-CM

## 2022-03-11 MED ORDER — ONDANSETRON 4 MG PO TBDP: 4.0000 mg | ORAL_TABLET | Freq: Three times a day (TID) | ORAL | 0 refills | Status: DC | PRN

## 2022-03-11 MED ORDER — PANTOPRAZOLE SODIUM 40 MG PO TBEC: 40.0000 mg | DELAYED_RELEASE_TABLET | Freq: Every day | ORAL | 0 refills | Status: DC

## 2022-03-11 NOTE — Patient Instructions (Signed)
Diana [name], thank you for joining Diana Rio, PA-C for today's virtual visit.  While this provider is not your primary care provider (PCP), if your PCP is located in our provider database this encounter information will be shared with them immediately following your visit.   [facility] account gives you access to today's visit and all your visits, tests, and labs performed at [facility] " click here if you don't have a [facility] account or go to [email or web address]  Consent: (Patient) Diana [name] provided verbal consent for this virtual visit at the beginning of the encounter.  Current Medications:  Current Outpatient Medications:    albuterol (VENTOLIN HFA) 108 (90 Base) MCG/ACT inhaler, INHALE 2 PUFFS INTO THE LUNGS EVERY 4 HOURS AS NEEDED FOR WHEEZE OR FOR SHORTNESS OF BREATH, Disp: 18 g, Rfl: 11   amLODipine (NORVASC) 10 MG tablet, Take 1 tablet (10 mg total) by mouth daily., Disp: 90 tablet, Rfl: 1   Cholecalciferol (VITAMIN D3) 50 MCG (2000 UT) capsule, TAKE 1 CAPSULE BY MOUTH EVERY DAY, Disp: 90 capsule, Rfl: 1   Fluticasone Propionate (XHANCE) 93 MCG/ACT EXHU, Place 1 spray into the nose in the morning and at bedtime., Disp: 16 mL, Rfl: 5   hydrochlorothiazide (HYDRODIURIL) 25 MG tablet, Take 1 tablet (25 mg total) by mouth daily., Disp: 90 tablet, Rfl: 3   Iron, Ferrous Sulfate, 325 (65 Fe) MG TABS, Take 325 mg by mouth daily., Disp: 90 tablet, Rfl: 1   labetalol (NORMODYNE) 100 MG tablet, Take 1 tablet (100 mg total) by mouth 2 (two) times daily., Disp: 180 tablet, Rfl: 1   potassium chloride SA (KLOR-CON M20) 20 MEQ tablet, TAKE 1/2 TABLETS (10 MEQ TOTAL) BY MOUTH DAILY., Disp: 45 tablet, Rfl: 1   Prenatal Vit-Fe Fumarate-FA (PRENATAL VITAMINS) 28-0.8 MG TABS, 1 tablet daily, Disp: 30 tablet, Rfl:    sertraline (ZOLOFT) 50 MG tablet, TAKE 1 TABLET BY MOUTH EVERY DAY, Disp: 90 tablet, Rfl: 1   traZODone (DESYREL) 50 MG tablet, Take  0.5-1 tablets (25-50 mg total) by mouth at bedtime as needed for sleep., Disp: 30 tablet, Rfl: 3   Medications ordered in this encounter:  No orders of the defined types were placed in this encounter.    *If you need refills on other medications prior to your next appointment, please contact your pharmacy*  Follow-Up: Call back or seek an in-person evaluation if the symptoms worsen or if the condition fails to improve as anticipated.  [facility] [phone]  Other Instructions Keep hydrated and rest. For the rest of the day, stick with a liquid diet. As things are easing up more, you can start adding solid foods (see recommendations below). Start an OTC Famotidine (Pepcid) nightly for the next couple of nights. Take the Pantoprazole as directed.  The zofran is to help with lingering nausea and prevent further vomiting. IF symptoms not substantially improving over next 24-48 hours, I want you to be evaluated in person. If anything acutely worsens, ER. DO NOT DELAY CARE.   If you have been instructed to have an in-person evaluation today at a local Urgent Care facility, please use the link below. It will take you to a list of all of our available [facility], including address, phone number and hours of operation. Please do not delay care.  [facility]  If you or a family member do not have a primary care provider, use the link below to  schedule a visit and establish care. When you choose a [facility] primary care physician or advanced practice provider, you gain a long-term partner in health. Find a Primary Care Provider  Learn more about [facility]'s in-office and virtual care options: [facility]

## 2022-03-11 NOTE — Progress Notes (Signed)
Virtual Visit Consent   [name], you are scheduled for a virtual visit with a [facility] provider today. Just as with appointments in the office, your consent must be obtained to participate. Your consent will be active for this visit and any virtual visit you may have with one of our providers in the next 365 days. If you have a MyChart account, a copy of this consent can be sent to you electronically.  As this is a virtual visit, video technology does not allow for your provider to perform a traditional examination. This may limit your provider's ability to fully assess your condition. If your provider identifies any concerns that need to be evaluated in person or the need to arrange testing (such as labs, EKG, etc.), we will make arrangements to do so. Although advances in technology are sophisticated, we cannot ensure that it will always work on either your end or our end. If the connection with a video visit is poor, the visit may have to be switched to a telephone visit. With either a video or telephone visit, we are not always able to ensure that we have a secure connection.  By engaging in this virtual visit, you consent to the provision of healthcare and authorize for your insurance to be billed (if applicable) for the services provided during this visit. Depending on your insurance coverage, you may receive a charge related to this service.  I need to obtain your verbal consent now. Are you willing to proceed with your visit today? [name] has provided verbal consent on 03/11/2022 for a virtual visit (video or telephone). [name], [place]  Date: 03/11/2022 3:41 PM  Virtual Visit via Video Note   I, [name], connected with  Diana [name]  ([id number], January 22, 1986) on 03/11/22 at  3:30 PM EST by a video-enabled telemedicine application and verified that I am speaking with the correct person using two identifiers.  Location: Patient: Virtual Visit Location Patient:  Home Provider: Virtual Visit Location Provider: Home Office   I discussed the limitations of evaluation and management by telemedicine and the availability of in person appointments. The patient expressed understanding and agreed to proceed.    History of Present Illness: Diana [name] is a 37 y.o. who identifies as a female who was assigned female at birth, and is being seen today for possible stomach bug. Notes symptoms satrting last night around 9 o clock with abdominal pain/cramping and urgency to have a BM. Later started with nausea/vomiting. Vomiting throughout the night. Reduced in frequency now with none in past few hours. Still with some intermittent upper abdominal pain associated with heartburn.  Denies fever, chills. Denies diarrhea.Denies increased use of NSAIDs, or any recent NSAID use.Denies alcohol consumption. No known family history of gallbladder.   HPI: HPI  Problems:  Patient Active Problem List   Diagnosis Date Noted   Insomnia 03/05/2022   Anxiety and depression 02/04/2022   Iron deficiency anemia due to chronic blood loss 09/05/2021   Right thyroid nodule 01/10/2020   Asthma 11/04/2019   Allergic rhinitis 08/16/2018   Essential hypertension 07/17/2018    Allergies:  Allergies  Allergen Reactions   Dog Epithelium Allergy Skin Test Itching   Gramineae Pollens Rash   Pseudoephedrine Palpitations    Raises BP also   Amoxicillin Itching and Rash   Medications:  Current Outpatient Medications:    ondansetron (ZOFRAN-ODT) 4 MG disintegrating tablet, Take 1 tablet (4 mg total) by mouth every 8 (eight) hours as  needed for nausea or vomiting., Disp: 20 tablet, Rfl: 0   pantoprazole (PROTONIX) 40 MG tablet, Take 1 tablet (40 mg total) by mouth daily., Disp: 14 tablet, Rfl: 0   albuterol (VENTOLIN HFA) 108 (90 Base) MCG/ACT inhaler, INHALE 2 PUFFS INTO THE LUNGS EVERY 4 HOURS AS NEEDED FOR WHEEZE OR FOR SHORTNESS OF BREATH, Disp: 18 g, Rfl: 11   amLODipine (NORVASC) 10  MG tablet, Take 1 tablet (10 mg total) by mouth daily., Disp: 90 tablet, Rfl: 1   Cholecalciferol (VITAMIN D3) 50 MCG (2000 UT) capsule, TAKE 1 CAPSULE BY MOUTH EVERY DAY, Disp: 90 capsule, Rfl: 1   Fluticasone Propionate (XHANCE) 93 MCG/ACT EXHU, Place 1 spray into the nose in the morning and at bedtime., Disp: 16 mL, Rfl: 5   hydrochlorothiazide (HYDRODIURIL) 25 MG tablet, Take 1 tablet (25 mg total) by mouth daily., Disp: 90 tablet, Rfl: 3   Iron, Ferrous Sulfate, 325 (65 Fe) MG TABS, Take 325 mg by mouth daily., Disp: 90 tablet, Rfl: 1   labetalol (NORMODYNE) 100 MG tablet, Take 1 tablet (100 mg total) by mouth 2 (two) times daily., Disp: 180 tablet, Rfl: 1   potassium chloride SA (KLOR-CON M20) 20 MEQ tablet, TAKE 1/2 TABLETS (10 MEQ TOTAL) BY MOUTH DAILY., Disp: 45 tablet, Rfl: 1   Prenatal Vit-Fe Fumarate-FA (PRENATAL VITAMINS) 28-0.8 MG TABS, 1 tablet daily, Disp: 30 tablet, Rfl:    sertraline (ZOLOFT) 50 MG tablet, TAKE 1 TABLET BY MOUTH EVERY DAY, Disp: 90 tablet, Rfl: 1   traZODone (DESYREL) 50 MG tablet, Take 0.5-1 tablets (25-50 mg total) by mouth at bedtime as needed for sleep., Disp: 30 tablet, Rfl: 3  Observations/Objective: Patient is well-developed, well-nourished in no acute distress.  Resting comfortably at home.  Head is normocephalic, atraumatic.  No labored breathing. Speech is clear and coherent with logical content.  Patient is alert and oriented at baseline.  Assessment and Plan: 1. Acute gastritis without hemorrhage, unspecified gastritis type - pantoprazole (PROTONIX) 40 MG tablet; Take 1 tablet (40 mg total) by mouth daily.  Dispense: 14 tablet; Refill: 0 - ondansetron (ZOFRAN-ODT) 4 MG disintegrating tablet; Take 1 tablet (4 mg total) by mouth every 8 (eight) hours as needed for nausea or vomiting.  Dispense: 20 tablet; Refill: 0  Afebrile. No intractable vomiting. No alarm signs or symptoms. Discussed gastritis versus pancreatitis but giving improving symptoms  and no history of episodic epigastric, LUQ pain, etc, less likely pancreatitis. Supportive measures and OTC medications reviewed. Start Pantoprazole and zofran per orders. Strict in-person follow-up precautions and ER precautions reviewed.   Follow Up Instructions: I discussed the assessment and treatment plan with the patient. The patient was provided an opportunity to ask questions and all were answered. The patient agreed with the plan and demonstrated an understanding of the instructions.  A copy of instructions were sent to the patient via MyChart unless otherwise noted below.   The patient was advised to call back or seek an in-person evaluation if the symptoms worsen or if the condition fails to improve as anticipated.  Time:  I spent 10 minutes with the patient via telehealth technology discussing the above problems/concerns.    [name], PA-C

## 2022-03-12 ENCOUNTER — Other Ambulatory Visit: Payer: Self-pay

## 2022-03-12 ENCOUNTER — Ambulatory Visit
Admission: RE | Admit: 2022-03-12 | Discharge: 2022-03-12 | Disposition: A | Discharge status: Home / Self Care | Payer: 59 | Source: Ambulatory Visit | Attending: Nurse Practitioner | Admitting: Nurse Practitioner

## 2022-03-12 VITALS — BP 145/87 | HR 90 | Temp 98.7°F | Resp 20

## 2022-03-12 DIAGNOSIS — R198 Other specified symptoms and signs involving the digestive system and abdomen: Secondary | ICD-10-CM | POA: Diagnosis not present

## 2022-03-12 DIAGNOSIS — R1013 Epigastric pain: Secondary | ICD-10-CM

## 2022-03-12 LAB — POCT URINALYSIS DIP (MANUAL ENTRY)
Glucose, UA: NEGATIVE mg/dL
Leukocytes, UA: NEGATIVE
Nitrite, UA: NEGATIVE
Protein Ur, POC: 100 mg/dL — AB
Spec Grav, UA: >=1.03 — AB (ref 1.010–1.025)
Urobilinogen, UA: 0.2 E.U./dL
pH, UA: 5.5 (ref 5.0–8.0)

## 2022-03-12 LAB — POCT URINE PREGNANCY: Preg Test, Ur: NEGATIVE

## 2022-03-12 MED ORDER — LIDOCAINE VISCOUS HCL 2 % MT SOLN
15.0000 mL | Freq: Once | OROMUCOSAL | Status: AC
  Administered 2022-03-12: 15 mL via OROMUCOSAL

## 2022-03-12 MED ORDER — ALUM & MAG HYDROXIDE-SIMETH 200-200-20 MG/5ML PO SUSP
30.0000 mL | Freq: Once | ORAL | Status: AC
  Administered 2022-03-12: 30 mL via ORAL

## 2022-03-12 MED ORDER — MYLANTA MAXIMUM STRENGTH 400-400-40 MG/5ML PO SUSP: 15.0000 mL | Freq: Four times a day (QID) | ORAL | 0 refills | Status: DC | PRN

## 2022-03-12 NOTE — ED Triage Notes (Signed)
Epigastric pain, emesis, abdominal swelling since Monday night. Pt reports emesis has stopped and reports epigastric pain is now radiating to back. Pt reports was seen for same on a video visit yesterday and reports was told possible gastritis but if pain continued to be seen in person.   Denies any known fevers, diarrhea. Last BM yesterday. Has been taking pepto otc with no change in symptoms. Denies any blood or black stool.

## 2022-03-12 NOTE — ED Provider Notes (Signed)
[facility]    CSN: [id number] Arrival date & time: 03/12/22  1403      History   Chief Complaint Chief Complaint  Patient presents with   Abdominal Pain    Abdominal pain and vommiting - Entered by patient    HPI Diana [name] is a 37 y.o. female.   The history is provided by the patient.   She presents for complaints of epigastric pain, nausea, vomiting, and bloating that started over the past several days.  Patient states  she has not had fever, but did have intermittent chills when she was vomiting.  She states over the last several days, the vomiting has ceased, but now she has pain that radiates to the middle of her back.  She denies urinary frequency, urgency, hesitancy, dysuria, or vaginal symptoms.  Patient states that she did complete a video visit 1 day ago and was prescribed famotidine and ondansetron.  She states that she was told at that time if symptoms do not improve to follow-up.  Patient states her last bowel movement was 1 day ago, denies any difficulty with the bowel movement.  She also states that she has not had any bloody or melena stools.  Patient denies history of reflux disease.  She reports that prior to her symptoms starting, she was participating in a water challenge at work where she had to drink at least eight 8 ounce glasses of water while she was on her shift.  She states that since her symptoms started she has also been eating broth.  She states when she eats the broth, she starts to hear her stomach make a lot of noises.  Patient states last evening, she did take Aleve, which did help her abdominal pain.  Last menstrual cycle was 02/21/2022.  Past Medical History:  Diagnosis Date   Asthma    Hypertension    Urticaria     Patient Active Problem List   Diagnosis Date Noted   Insomnia 03/05/2022   Anxiety and depression 02/04/2022   Iron deficiency anemia due to chronic blood loss 09/05/2021   Right thyroid nodule 01/10/2020   Asthma  11/04/2019   Allergic rhinitis 08/16/2018   Essential hypertension 07/17/2018    History reviewed. No pertinent surgical history.  OB History   No obstetric history on file.      Home Medications    Prior to Admission medications   Medication Sig Start Date End Date Taking? Authorizing Provider  alum & mag hydroxide-simeth (MYLANTA MAXIMUM STRENGTH) 400-400-40 MG/5ML suspension Take 15 mLs by mouth every 6 (six) hours as needed for indigestion. 03/12/22  Yes Doralene Glanz-Warren, Sadie Haber, NP  albuterol (VENTOLIN HFA) 108 (90 Base) MCG/ACT inhaler INHALE 2 PUFFS INTO THE LUNGS EVERY 4 HOURS AS NEEDED FOR WHEEZE OR FOR SHORTNESS OF BREATH 04/11/21   [employer], Alvino Chapel, FNP  amLODipine (NORVASC) 10 MG tablet Take 1 tablet (10 mg total) by mouth daily. 01/15/22   Tommie Sams, DO  Cholecalciferol (VITAMIN D3) 50 MCG (2000 UT) capsule TAKE 1 CAPSULE BY MOUTH EVERY DAY 06/12/21   [employer], Alvino Chapel, FNP  Fluticasone Propionate (XHANCE) 93 MCG/ACT EXHU Place 1 spray into the nose in the morning and at bedtime. 06/17/21 03/05/22  Alfonse Spruce, MD  hydrochlorothiazide (HYDRODIURIL) 25 MG tablet Take 1 tablet (25 mg total) by mouth daily. 01/15/22   Tommie Sams, DO  Iron, Ferrous Sulfate, 325 (65 Fe) MG TABS Take 325 mg by mouth daily. 03/05/22   Adriana Simas,  Jayce G, DO  labetalol (NORMODYNE) 100 MG tablet Take 1 tablet (100 mg total) by mouth 2 (two) times daily. 01/15/22   Coral Spikes, DO  ondansetron (ZOFRAN-ODT) 4 MG disintegrating tablet Take 1 tablet (4 mg total) by mouth every 8 (eight) hours as needed for nausea or vomiting. 03/11/22   Brunetta Jeans, PA-C  pantoprazole (PROTONIX) 40 MG tablet Take 1 tablet (40 mg total) by mouth daily. 03/11/22   Brunetta Jeans, PA-C  potassium chloride SA (KLOR-CON M20) 20 MEQ tablet TAKE 1/2 TABLETS (10 MEQ TOTAL) BY MOUTH DAILY. 01/15/22   Coral Spikes, DO  Prenatal Vit-Fe Fumarate-FA (PRENATAL VITAMINS) 28-0.8 MG TABS 1 tablet daily 07/16/18   Alycia Rossetti, MD  sertraline (ZOLOFT) 50 MG tablet TAKE 1 TABLET BY MOUTH EVERY DAY Patient taking differently: Take 50 mg by mouth as needed. 02/27/22   Coral Spikes, DO  traZODone (DESYREL) 50 MG tablet Take 0.5-1 tablets (25-50 mg total) by mouth at bedtime as needed for sleep. 03/05/22   Coral Spikes, DO    Family History Family History  Problem Relation Age of Onset   Hypertension Mother    Hypertension Father    Asthma Sister    Allergic rhinitis Paternal Grandmother     Social History Social History   Tobacco Use   Smoking status: Never   Smokeless tobacco: Never  Vaping Use   Vaping Use: Never used  Substance Use Topics   Alcohol use: No   Drug use: No     Allergies   Dog epithelium allergy skin test, Gramineae pollens, Pseudoephedrine, and Amoxicillin   Review of Systems Review of Systems Per HPI  Physical Exam Triage Vital Signs ED Triage Vitals [03/12/22 1504]  Enc Vitals Group     BP (!) 145/87     Pulse Rate 90     Resp 20     Temp 98.7 F (37.1 C)     Temp Source Oral     SpO2 99 %     Weight      Height      Head Circumference      Peak Flow      Pain Score 8     Pain Loc      Pain Edu?      Excl. in [place]?    No data found.  Updated Vital Signs BP (!) 145/87 (BP Location: Right Arm)   Pulse 90   Temp 98.7 F (37.1 C) (Oral)   Resp 20   LMP 02/21/2022   SpO2 99%   Visual Acuity Right Eye Distance:   Left Eye Distance:   Bilateral Distance:    Right Eye Near:   Left Eye Near:    Bilateral Near:     Physical Exam Vitals and nursing note reviewed.  Constitutional:      General: She is not in acute distress.    Appearance: She is well-developed.  HENT:     Head: Normocephalic.     Mouth/Throat:     Mouth: Mucous membranes are dry.  Eyes:     Extraocular Movements: Extraocular movements intact.     Pupils: Pupils are equal, round, and reactive to light.  Cardiovascular:     Rate and Rhythm: Regular rhythm.     Heart  sounds: Normal heart sounds.  Pulmonary:     Effort: Pulmonary effort is normal. No respiratory distress.     Breath sounds: Normal breath sounds. No stridor. No  wheezing, rhonchi or rales.  Abdominal:     Palpations: Abdomen is soft.     Tenderness: There is abdominal tenderness in the right upper quadrant. There is no right CVA tenderness, left CVA tenderness or guarding.     Comments: GI cocktail was administered with relief of the patient's symptoms.  Skin:    General: Skin is warm and dry.  Neurological:     General: No focal deficit present.     Mental Status: She is alert and oriented to person, place, and time.  Psychiatric:        Mood and Affect: Mood normal.        Behavior: Behavior normal.      UC Treatments / Results  Labs (all labs ordered are listed, but only abnormal results are displayed) Labs Reviewed  POCT URINALYSIS DIP (MANUAL ENTRY) - Abnormal; Notable for the following components:      Result Value   Clarity, UA hazy (*)    Bilirubin, UA small (*)    Ketones, POC UA small (15) (*)    Spec Grav, UA >=1.030 (*)    Blood, UA trace-intact (*)    Protein Ur, POC =100 (*)    All other components within normal limits  POCT URINE PREGNANCY    EKG   Radiology No results found.  Procedures Procedures (including critical care time)  Medications Ordered in UC Medications  alum & mag hydroxide-simeth (MAALOX/MYLANTA) 200-200-20 MG/5ML suspension 30 mL (30 mLs Oral Given 03/12/22 1538)  lidocaine (XYLOCAINE) 2 % viscous mouth solution 15 mL (15 mLs Mouth/Throat Given 03/12/22 1537)    Initial Impression / Assessment and Plan / UC Course  I have reviewed the triage vital signs and the nursing notes.  Pertinent labs & imaging results that were available during my care of the patient were reviewed by me and considered in my medical decision making (see chart for details).  The patient is well-appearing, she is in no acute distress, she is mildly  hypertensive, but vital signs are otherwise stable.  Urinalysis is negative for acute infection, urine culture is pending.  Urine pregnancy was also negative.  GI cocktail was administered with good relief of the patient's symptoms.  Symptoms appear to be consistent with reflux symptoms.  Patient does not display signs of acute abdomen at this time.  Will treat patient with Maalox/Mylanta for reflux symptoms.  Patient was advised to take 2 of the Protonix she was prescribed 1 day ago and then begin taking 1 tablet daily.  Discussed strict ER precaution with the patient, advised patient to follow-up within the next 24 hours if symptoms fail to improve.  Supportive care recommendations were provided to the patient relief of current symptoms.  Patient verbalizes understanding.  All questions were answered.  Patient stable for discharge.  Note was provided.   Final Clinical Impressions(s) / UC Diagnoses   Final diagnoses:  Abdominal pain, epigastric  Symptoms of gastroesophageal reflux     Discharge Instructions      Your urinalysis indicates she need to increase your water intake. Take medication as prescribed.  As discussed, take 2 of the pantoprazole (Protonix tablets) or 80 mg today.  Tomorrow begin taking 1 tablet daily. Increase fluids and allow for plenty of rest. May take over-the-counter Tylenol or ibuprofen as needed for pain, fever, general discomfort. Continue a bland diet while symptoms persist.  A brat diet will be helpful as well, this includes bananas, rice, applesauce, and toast. Recommend eating 6 small  meals instead of 3 large meals. Eat at least 2 to 3 hours before bedtime. Avoid food to aggravate possible reflux symptoms to include caffeine, dairy, spicy foods, or tomato-based foods, As discussed, if symptoms do not improve within the next 24 hours,      ED Prescriptions     Medication Sig Dispense Auth. Provider   alum & mag hydroxide-simeth (MYLANTA MAXIMUM  STRENGTH) 400-400-40 MG/5ML suspension Take 15 mLs by mouth every 6 (six) hours as needed for indigestion. 355 mL Seaver Machia-Warren, Alda Lea, NP      PDMP not reviewed this encounter.   Tish Men, NP 03/12/22 [phone]

## 2022-03-12 NOTE — Discharge Instructions (Addendum)
Your urinalysis indicates she need to increase your water intake. Take medication as prescribed.  As discussed, take 2 of the pantoprazole (Protonix tablets) or 80 mg today.  Tomorrow begin taking 1 tablet daily. Increase fluids and allow for plenty of rest. May take over-the-counter Tylenol or ibuprofen as needed for pain, fever, general discomfort. Continue a bland diet while symptoms persist.  A brat diet will be helpful as well, this includes bananas, rice, applesauce, and toast. Recommend eating 6 small meals instead of 3 large meals. Eat at least 2 to 3 hours before bedtime. Avoid food to aggravate possible reflux symptoms to include caffeine, dairy, spicy foods, or tomato-based foods, As discussed, if symptoms do not improve within the next 24 hours,

## 2022-03-18 ENCOUNTER — Ambulatory Visit: Payer: 59 | Admitting: Family Medicine

## 2022-03-21 ENCOUNTER — Inpatient Hospital Stay: Payer: 59 | Attending: Hematology

## 2022-03-21 VITALS — BP 122/81 | HR 78 | Temp 99.0°F | Resp 18

## 2022-03-21 DIAGNOSIS — Z79899 Other long term (current) drug therapy: Secondary | ICD-10-CM | POA: Insufficient documentation

## 2022-03-21 DIAGNOSIS — D509 Iron deficiency anemia, unspecified: Secondary | ICD-10-CM | POA: Insufficient documentation

## 2022-03-21 DIAGNOSIS — D500 Iron deficiency anemia secondary to blood loss (chronic): Secondary | ICD-10-CM

## 2022-03-21 MED ORDER — SODIUM CHLORIDE 0.9 % IV SOLN: Freq: Once | INTRAVENOUS | Status: AC

## 2022-03-21 MED ORDER — CETIRIZINE HCL 10 MG PO TABS
10.0000 mg | ORAL_TABLET | Freq: Once | ORAL | Status: AC
  Administered 2022-03-21: 10 mg via ORAL
  Filled 2022-03-21: qty 1

## 2022-03-21 MED ORDER — SODIUM CHLORIDE 0.9 % IV SOLN
300.0000 mg | Freq: Once | INTRAVENOUS | Status: AC
  Administered 2022-03-21: 300 mg via INTRAVENOUS
  Filled 2022-03-21: qty 15

## 2022-03-21 MED ORDER — ACETAMINOPHEN 325 MG PO TABS
650.0000 mg | ORAL_TABLET | Freq: Once | ORAL | Status: AC
  Administered 2022-03-21: 650 mg via ORAL
  Filled 2022-03-21: qty 2

## 2022-03-21 NOTE — Progress Notes (Signed)
Patient presents today for iron infusion.  Patient is in satisfactory condition with no complaints voiced.  Vital signs are stable.  IV placed in left AC.  IV flushed well with good blood return noted.  We will proceed with infusion per provider orders.  Left AC IV was removed due to occlusion.  IV was restarted in right AC.  IV flushed well with good blood return noted.    Patient tolerated infusion well with no complaints voiced.  Patient left ambulatory in stable condition.  Vital signs stable at discharge.  Follow up as scheduled.

## 2022-03-21 NOTE — Patient Instructions (Signed)
[facility]  Discharge Instructions: Thank you for choosing [facility] to provide your oncology and hematology care.  If you have a lab appointment with the [facility], please come in thru the Main Entrance and check in at the main information desk.  Wear comfortable clothing and clothing appropriate for easy access to any Portacath or PICC line.   We strive to give you quality time with your provider. You may need to reschedule your appointment if you arrive late (15 or more minutes).  Arriving late affects you and other patients whose appointments are after yours.  Also, if you miss three or more appointments without notifying the office, you may be dismissed from the clinic at the provider's discretion.      For prescription refill requests, have your pharmacy contact our office and allow 72 hours for refills to be completed.    Iron Sucrose Injection What is this medication? IRON SUCROSE (EYE ern SOO krose) treats low levels of iron (iron deficiency anemia) in people with kidney disease. Iron is a mineral that plays an important role in making red blood cells, which carry oxygen from your lungs to the rest of your body. This medicine may be used for other purposes; ask your health care provider or pharmacist if you have questions. COMMON BRAND NAME(S): Venofer What should I tell my care team before I take this medication? They need to know if you have any of these conditions: Anemia not caused by low iron levels Heart disease High levels of iron in the blood Kidney disease Liver disease An unusual or allergic reaction to iron, other medications, foods, dyes, or preservatives Pregnant or trying to get pregnant Breastfeeding How should I use this medication? This medication is for infusion into a vein. It is given in a hospital or clinic setting. Talk to your care team about the use of this medication in children. While this medication may be  prescribed for children as young as 2 years for selected conditions, precautions do apply. Overdosage: If you think you have taken too much of this medicine contact a poison control center or emergency room at once. NOTE: This medicine is only for you. Do not share this medicine with others. What if I miss a dose? Keep appointments for follow-up doses. It is important not to miss your dose. Call your care team if you are unable to keep an appointment. What may interact with this medication? Do not take this medication with any of the following: Deferoxamine Dimercaprol Other iron products This medication may also interact with the following: Chloramphenicol Deferasirox This list may not describe all possible interactions. Give your health care provider a list of all the medicines, herbs, non-prescription drugs, or dietary supplements you use. Also tell them if you smoke, drink alcohol, or use illegal drugs. Some items may interact with your medicine. What should I watch for while using this medication? Visit your care team regularly. Tell your care team if your symptoms do not start to get better or if they get worse. You may need blood work done while you are taking this medication. You may need to follow a special diet. Talk to your care team. Foods that contain iron include: whole grains/cereals, dried fruits, beans, or peas, leafy green vegetables, and organ meats (liver, kidney). What side effects may I notice from receiving this medication? Side effects that you should report to your care team as soon as possible: Allergic reactions--skin rash, itching, hives,  swelling of the face, lips, tongue, or throat Low blood pressure--dizziness, feeling faint or lightheaded, blurry vision Shortness of breath Side effects that usually do not require medical attention (report to your care team if they continue or are bothersome): Flushing Headache Joint pain Muscle pain Nausea Pain, redness, or  irritation at injection site This list may not describe all possible side effects. Call your doctor for medical advice about side effects. You may report side effects to FDA at 1-800-FDA-1088. Where should I keep my medication? This medication is given in a hospital or clinic and will not be stored at home. NOTE: This sheet is a summary. It may not cover all possible information. If you have questions about this medicine, talk to your doctor, pharmacist, or health care provider.  2023 Elsevier/Gold Standard (2020-05-03 00:00:00)    To help prevent nausea and vomiting after your treatment, we encourage you to take your nausea medication as directed.  BELOW ARE SYMPTOMS THAT SHOULD BE REPORTED IMMEDIATELY: *FEVER GREATER THAN 100.4 F (38 C) OR HIGHER *CHILLS OR SWEATING *NAUSEA AND VOMITING THAT IS NOT CONTROLLED WITH YOUR NAUSEA MEDICATION *UNUSUAL SHORTNESS OF BREATH *UNUSUAL BRUISING OR BLEEDING *URINARY PROBLEMS (pain or burning when urinating, or frequent urination) *BOWEL PROBLEMS (unusual diarrhea, constipation, pain near the anus) TENDERNESS IN MOUTH AND THROAT WITH OR WITHOUT PRESENCE OF ULCERS (sore throat, sores in mouth, or a toothache) UNUSUAL RASH, SWELLING OR PAIN  UNUSUAL VAGINAL DISCHARGE OR ITCHING   Items with * indicate a potential emergency and should be followed up as soon as possible or go to the Emergency Department if any problems should occur.  Please show the CHEMOTHERAPY ALERT CARD or IMMUNOTHERAPY ALERT CARD at check-in to the Emergency Department and triage nurse.  Should you have questions after your visit or need to cancel or reschedule your appointment, please contact [facility] [phone]  and follow the prompts.  Office hours are 8:00 a.m. to 4:30 p.m. Monday - Friday. Please note that voicemails left after 4:00 p.m. may not be returned until the following business day.  We are closed weekends and major holidays. You have access to  a nurse at all times for urgent questions. Please call the main number to the clinic [phone] and follow the prompts.  For any non-urgent questions, you may also contact your provider using MyChart. We now offer e-Visits for anyone 20 and older to request care online for non-urgent symptoms. For details visit [email or web address].   Also download the MyChart app! Go to the app store, search "MyChart", open the app, select [facility], and log in with your MyChart username and password.

## 2022-03-24 ENCOUNTER — Encounter: Payer: Self-pay | Admitting: Family Medicine

## 2022-03-24 DIAGNOSIS — Z0000 Encounter for general adult medical examination without abnormal findings: Secondary | ICD-10-CM

## 2022-03-28 ENCOUNTER — Other Ambulatory Visit: Payer: Self-pay | Admitting: Family Medicine

## 2022-03-28 ENCOUNTER — Inpatient Hospital Stay: Payer: 59

## 2022-03-28 VITALS — BP 116/75 | HR 89 | Temp 97.9°F | Resp 18

## 2022-03-28 DIAGNOSIS — D509 Iron deficiency anemia, unspecified: Secondary | ICD-10-CM | POA: Diagnosis not present

## 2022-03-28 DIAGNOSIS — D500 Iron deficiency anemia secondary to blood loss (chronic): Secondary | ICD-10-CM

## 2022-03-28 MED ORDER — SODIUM CHLORIDE 0.9 % IV SOLN
300.0000 mg | Freq: Once | INTRAVENOUS | Status: AC
  Administered 2022-03-28: 300 mg via INTRAVENOUS
  Filled 2022-03-28: qty 300

## 2022-03-28 MED ORDER — SODIUM CHLORIDE 0.9 % IV SOLN: Freq: Once | INTRAVENOUS | Status: AC

## 2022-03-28 MED ORDER — ACETAMINOPHEN 325 MG PO TABS
650.0000 mg | ORAL_TABLET | Freq: Once | ORAL | Status: AC
  Administered 2022-03-28: 650 mg via ORAL
  Filled 2022-03-28: qty 2

## 2022-03-28 MED ORDER — CETIRIZINE HCL 10 MG PO TABS
10.0000 mg | ORAL_TABLET | Freq: Once | ORAL | Status: AC
  Administered 2022-03-28: 10 mg via ORAL
  Filled 2022-03-28: qty 1

## 2022-03-28 NOTE — Patient Instructions (Signed)
[facility]  Discharge Instructions: Thank you for choosing [facility] to provide your oncology and hematology care.  If you have a lab appointment with the [facility], please come in thru the Main Entrance and check in at the main information desk.  Wear comfortable clothing and clothing appropriate for easy access to any Portacath or PICC line.   We strive to give you quality time with your provider. You may need to reschedule your appointment if you arrive late (15 or more minutes).  Arriving late affects you and other patients whose appointments are after yours.  Also, if you miss three or more appointments without notifying the office, you may be dismissed from the clinic at the provider's discretion.      For prescription refill requests, have your pharmacy contact our office and allow 72 hours for refills to be completed.    Today you received the following chemotherapy and/or immunotherapy agents Venofer      To help prevent nausea and vomiting after your treatment, we encourage you to take your nausea medication as directed.  BELOW ARE SYMPTOMS THAT SHOULD BE REPORTED IMMEDIATELY: *FEVER GREATER THAN 100.4 F (38 C) OR HIGHER *CHILLS OR SWEATING *NAUSEA AND VOMITING THAT IS NOT CONTROLLED WITH YOUR NAUSEA MEDICATION *UNUSUAL SHORTNESS OF BREATH *UNUSUAL BRUISING OR BLEEDING *URINARY PROBLEMS (pain or burning when urinating, or frequent urination) *BOWEL PROBLEMS (unusual diarrhea, constipation, pain near the anus) TENDERNESS IN MOUTH AND THROAT WITH OR WITHOUT PRESENCE OF ULCERS (sore throat, sores in mouth, or a toothache) UNUSUAL RASH, SWELLING OR PAIN  UNUSUAL VAGINAL DISCHARGE OR ITCHING   Items with * indicate a potential emergency and should be followed up as soon as possible or go to the Emergency Department if any problems should occur.  Please show the CHEMOTHERAPY ALERT CARD or IMMUNOTHERAPY ALERT CARD at check-in to the Emergency  Department and triage nurse.  Should you have questions after your visit or need to cancel or reschedule your appointment, please contact [facility] [phone]  and follow the prompts.  Office hours are 8:00 a.m. to 4:30 p.m. Monday - Friday. Please note that voicemails left after 4:00 p.m. may not be returned until the following business day.  We are closed weekends and major holidays. You have access to a nurse at all times for urgent questions. Please call the main number to the clinic [phone] and follow the prompts.  For any non-urgent questions, you may also contact your provider using MyChart. We now offer e-Visits for anyone 52 and older to request care online for non-urgent symptoms. For details visit [email or web address].   Also download the MyChart app! Go to the app store, search "MyChart", open the app, select [facility], and log in with your MyChart username and password.

## 2022-03-28 NOTE — Progress Notes (Signed)
Patient presents today for Venofer infusion per providers order.  Vital signs WNL.  Patient has no new complaints at this time.  Peripheral IV started and blood return noted pre and post infusion.  Stable during infusion without adverse affects.  Vital signs stable.  No complaints at this time.  Discharge from clinic ambulatory in stable condition.  Alert and oriented X 3.  Follow up with [facility] as scheduled.  

## 2022-04-04 ENCOUNTER — Inpatient Hospital Stay: Payer: 59 | Attending: Hematology

## 2022-04-04 VITALS — BP 115/77 | HR 78 | Temp 98.1°F | Resp 18

## 2022-04-04 DIAGNOSIS — D500 Iron deficiency anemia secondary to blood loss (chronic): Secondary | ICD-10-CM

## 2022-04-04 MED ORDER — ACETAMINOPHEN 325 MG PO TABS
650.0000 mg | ORAL_TABLET | Freq: Once | ORAL | Status: AC
  Administered 2022-04-04: 650 mg via ORAL

## 2022-04-04 MED ORDER — SODIUM CHLORIDE 0.9 % IV SOLN
Freq: Once | INTRAVENOUS | Status: AC
  Administered 2022-04-04: 500 mL via INTRAVENOUS

## 2022-04-04 MED ORDER — SODIUM CHLORIDE 0.9 % IV SOLN
300.0000 mg | Freq: Once | INTRAVENOUS | Status: AC
  Administered 2022-04-04: 300 mg via INTRAVENOUS
  Filled 2022-04-04: qty 300

## 2022-04-04 MED ORDER — CETIRIZINE HCL 10 MG PO TABS
10.0000 mg | ORAL_TABLET | Freq: Once | ORAL | Status: AC
  Administered 2022-04-04: 10 mg via ORAL
  Filled 2022-04-04: qty 1

## 2022-04-04 NOTE — Patient Instructions (Signed)
[facility]  Discharge Instructions: Thank you for choosing [facility] to provide your oncology and hematology care.  If you have a lab appointment with the [facility], please come in thru the Main Entrance and check in at the main information desk.  Wear comfortable clothing and clothing appropriate for easy access to any Portacath or PICC line.   We strive to give you quality time with your provider. You may need to reschedule your appointment if you arrive late (15 or more minutes).  Arriving late affects you and other patients whose appointments are after yours.  Also, if you miss three or more appointments without notifying the office, you may be dismissed from the clinic at the provider's discretion.      For prescription refill requests, have your pharmacy contact our office and allow 72 hours for refills to be completed.    Iron Sucrose Injection What is this medication? IRON SUCROSE (EYE ern SOO krose) treats low levels of iron (iron deficiency anemia) in people with kidney disease. Iron is a mineral that plays an important role in making red blood cells, which carry oxygen from your lungs to the rest of your body. This medicine may be used for other purposes; ask your health care provider or pharmacist if you have questions. COMMON BRAND NAME(S): Venofer What should I tell my care team before I take this medication? They need to know if you have any of these conditions: Anemia not caused by low iron levels Heart disease High levels of iron in the blood Kidney disease Liver disease An unusual or allergic reaction to iron, other medications, foods, dyes, or preservatives Pregnant or trying to get pregnant Breastfeeding How should I use this medication? This medication is for infusion into a vein. It is given in a hospital or clinic setting. Talk to your care team about the use of this medication in children. While this medication may be  prescribed for children as young as 2 years for selected conditions, precautions do apply. Overdosage: If you think you have taken too much of this medicine contact a poison control center or emergency room at once. NOTE: This medicine is only for you. Do not share this medicine with others. What if I miss a dose? Keep appointments for follow-up doses. It is important not to miss your dose. Call your care team if you are unable to keep an appointment. What may interact with this medication? Do not take this medication with any of the following: Deferoxamine Dimercaprol Other iron products This medication may also interact with the following: Chloramphenicol Deferasirox This list may not describe all possible interactions. Give your health care provider a list of all the medicines, herbs, non-prescription drugs, or dietary supplements you use. Also tell them if you smoke, drink alcohol, or use illegal drugs. Some items may interact with your medicine. What should I watch for while using this medication? Visit your care team regularly. Tell your care team if your symptoms do not start to get better or if they get worse. You may need blood work done while you are taking this medication. You may need to follow a special diet. Talk to your care team. Foods that contain iron include: whole grains/cereals, dried fruits, beans, or peas, leafy green vegetables, and organ meats (liver, kidney). What side effects may I notice from receiving this medication? Side effects that you should report to your care team as soon as possible: Allergic reactions--skin rash, itching, hives,  swelling of the face, lips, tongue, or throat Low blood pressure--dizziness, feeling faint or lightheaded, blurry vision Shortness of breath Side effects that usually do not require medical attention (report to your care team if they continue or are bothersome): Flushing Headache Joint pain Muscle pain Nausea Pain, redness, or  irritation at injection site This list may not describe all possible side effects. Call your doctor for medical advice about side effects. You may report side effects to FDA at 1-800-FDA-1088. Where should I keep my medication? This medication is given in a hospital or clinic and will not be stored at home. NOTE: This sheet is a summary. It may not cover all possible information. If you have questions about this medicine, talk to your doctor, pharmacist, or health care provider.  2023 Elsevier/Gold Standard (2020-05-03 00:00:00)    To help prevent nausea and vomiting after your treatment, we encourage you to take your nausea medication as directed.  BELOW ARE SYMPTOMS THAT SHOULD BE REPORTED IMMEDIATELY: *FEVER GREATER THAN 100.4 F (38 C) OR HIGHER *CHILLS OR SWEATING *NAUSEA AND VOMITING THAT IS NOT CONTROLLED WITH YOUR NAUSEA MEDICATION *UNUSUAL SHORTNESS OF BREATH *UNUSUAL BRUISING OR BLEEDING *URINARY PROBLEMS (pain or burning when urinating, or frequent urination) *BOWEL PROBLEMS (unusual diarrhea, constipation, pain near the anus) TENDERNESS IN MOUTH AND THROAT WITH OR WITHOUT PRESENCE OF ULCERS (sore throat, sores in mouth, or a toothache) UNUSUAL RASH, SWELLING OR PAIN  UNUSUAL VAGINAL DISCHARGE OR ITCHING   Items with * indicate a potential emergency and should be followed up as soon as possible or go to the Emergency Department if any problems should occur.  Please show the CHEMOTHERAPY ALERT CARD or IMMUNOTHERAPY ALERT CARD at check-in to the Emergency Department and triage nurse.  Should you have questions after your visit or need to cancel or reschedule your appointment, please contact [facility] [phone]  and follow the prompts.  Office hours are 8:00 a.m. to 4:30 p.m. Monday - Friday. Please note that voicemails left after 4:00 p.m. may not be returned until the following business day.  We are closed weekends and major holidays. You have access to  a nurse at all times for urgent questions. Please call the main number to the clinic [phone] and follow the prompts.  For any non-urgent questions, you may also contact your provider using MyChart. We now offer e-Visits for anyone 35 and older to request care online for non-urgent symptoms. For details visit [email or web address].   Also download the MyChart app! Go to the app store, search "MyChart", open the app, select [facility], and log in with your MyChart username and password.

## 2022-04-04 NOTE — Progress Notes (Signed)
Patient presents today for Venofer infusion.  Patient is in satisfactory condition with no new complaints voiced.  Vital signs are stable.  IV place in L hand.  IV flushed well with good blood return noted.  We will proceed with infusion per provider orders.   Patient tolerated treatment well with no complaints voiced.  Patient left ambulatory in stable condition.  Vital signs stable at discharge.  Follow up as scheduled.

## 2022-04-15 ENCOUNTER — Ambulatory Visit (INDEPENDENT_AMBULATORY_CARE_PROVIDER_SITE_OTHER): Payer: 59 | Admitting: Family Medicine

## 2022-04-15 VITALS — BP 123/80 | HR 80 | Wt 154.4 lb

## 2022-04-15 DIAGNOSIS — F419 Anxiety disorder, unspecified: Secondary | ICD-10-CM | POA: Diagnosis not present

## 2022-04-15 DIAGNOSIS — I10 Essential (primary) hypertension: Secondary | ICD-10-CM

## 2022-04-15 DIAGNOSIS — F32A Depression, unspecified: Secondary | ICD-10-CM | POA: Diagnosis not present

## 2022-04-15 DIAGNOSIS — J301 Allergic rhinitis due to pollen: Secondary | ICD-10-CM | POA: Diagnosis not present

## 2022-04-15 MED ORDER — CETIRIZINE HCL 10 MG PO TABS: 10.0000 mg | ORAL_TABLET | Freq: Every day | ORAL | 0 refills | Status: DC

## 2022-04-15 MED ORDER — IPRATROPIUM BROMIDE 0.06 % NA SOLN: 2.0000 | Freq: Four times a day (QID) | NASAL | 0 refills | Status: DC | PRN

## 2022-04-15 NOTE — Patient Instructions (Signed)
Meds as prescribed.  Follow up in 6 months.

## 2022-04-16 NOTE — Assessment & Plan Note (Signed)
-  Stable -Continue Zoloft 

## 2022-04-16 NOTE — Assessment & Plan Note (Signed)
Stable.  Continue current medications.

## 2022-04-16 NOTE — Assessment & Plan Note (Signed)
Zyrtec and Atrovent as prescribed.

## 2022-04-16 NOTE — Progress Notes (Signed)
Subjective:  Patient ID: Diana [name], female    DOB: [birth year]  Age: 37 y.o. MRN: [id number]  CC: Chief Complaint  Patient presents with   Hypertension    Follow up     HPI:  37 year old female presents for follow-up.  Hypertension is well-controlled.  She is doing well on amlodipine, labetalol, and HCTZ.  Patient reports that her mood is doing well on Zoloft.  Patient reports recent development of sinus pressure and congestion.  It has been going on for 2 days.  No fever.  No relieving factors.  No other complaints.  Patient Active Problem List   Diagnosis Date Noted   Insomnia 03/05/2022   Anxiety and depression 02/04/2022   Iron deficiency anemia due to chronic blood loss 09/05/2021   Right thyroid nodule 01/10/2020   Asthma 11/04/2019   Allergic rhinitis 08/16/2018   Essential hypertension 07/17/2018    Social Hx   Social History   Socioeconomic History   Marital status: Single    Spouse name: Not on file   Number of children: Not on file   Years of education: Not on file   Highest education level: Not on file  Occupational History   Not on file  Tobacco Use   Smoking status: Never   Smokeless tobacco: Never  Vaping Use   Vaping Use: Never used  Substance and Sexual Activity   Alcohol use: No   Drug use: No   Sexual activity: Not on file  Other Topics Concern   Not on file  Social History Narrative   Not on file   Social Determinants of Health   Financial Resource Strain: Not on file  Food Insecurity: Not on file  Transportation Needs: Not on file  Physical Activity: Not on file  Stress: Not on file  Social Connections: Not on file    Review of Systems Per HPI  Objective:  BP 123/80   Pulse 80   Wt 154 lb 6.4 oz (70 kg)   SpO2 99%   BMI 28.24 kg/m      04/15/2022    2:13 PM 04/04/2022   11:20 AM 04/04/2022    8:32 AM  BP/Weight  Systolic BP [id number]  Diastolic BP 80 77 77  Wt. (Lbs) 154.4    BMI 28.24 kg/m2      Physical  Exam Vitals and nursing note reviewed.  Constitutional:      General: She is not in acute distress.    Appearance: Normal appearance.  HENT:     Head: Normocephalic and atraumatic.  Cardiovascular:     Rate and Rhythm: Normal rate and regular rhythm.  Pulmonary:     Effort: Pulmonary effort is normal.     Breath sounds: Normal breath sounds. No wheezing, rhonchi or rales.  Neurological:     Mental Status: She is alert.  Psychiatric:     Comments: Flat affect.     Lab Results  Component Value Date   WBC 6.2 02/25/2022   HGB 10.7 (L) 02/25/2022   HCT 34.2 (L) 02/25/2022   PLT 390 02/25/2022   GLUCOSE 98 09/16/2021   CHOL 185 04/11/2021   TRIG 96 04/11/2021   HDL 55 04/11/2021   LDLCALC 113 (H) 04/11/2021   ALT 18 05/17/2021   AST 14 05/17/2021   NA 136 09/16/2021   K 3.4 (L) 09/16/2021   CL 104 09/16/2021   CREATININE 0.61 09/16/2021   BUN 8 09/16/2021   CO2 24 09/16/2021   TSH  0.815 04/11/2021   HGBA1C 5.3 06/22/2020     Assessment & Plan:   Problem List Items Addressed This Visit       Cardiovascular and Mediastinum   Essential hypertension - Primary    Stable.  Continue current medications.        Respiratory   Allergic rhinitis    Zyrtec and Atrovent as prescribed.        Other   Anxiety and depression    Stable.  Continue Zoloft.       Meds ordered this encounter  Medications   cetirizine (ZYRTEC ALLERGY) 10 MG tablet    Sig: Take 1 tablet (10 mg total) by mouth daily.    Dispense:  30 tablet    Refill:  0   ipratropium (ATROVENT) 0.06 % nasal spray    Sig: Place 2 sprays into both nostrils 4 (four) times daily as needed for rhinitis.    Dispense:  15 mL    Refill:  0    Follow-up:  Return in about 6 months (around 10/16/2022).  [place]

## 2022-05-07 ENCOUNTER — Other Ambulatory Visit: Payer: Self-pay | Admitting: Family Medicine

## 2022-05-30 ENCOUNTER — Inpatient Hospital Stay: Payer: 59

## 2022-06-02 ENCOUNTER — Encounter: Payer: Self-pay | Admitting: Hematology

## 2022-06-02 ENCOUNTER — Inpatient Hospital Stay: Payer: 59 | Attending: Hematology

## 2022-06-02 DIAGNOSIS — D509 Iron deficiency anemia, unspecified: Secondary | ICD-10-CM

## 2022-06-02 DIAGNOSIS — D500 Iron deficiency anemia secondary to blood loss (chronic): Secondary | ICD-10-CM | POA: Diagnosis present

## 2022-06-02 DIAGNOSIS — N920 Excessive and frequent menstruation with regular cycle: Secondary | ICD-10-CM | POA: Insufficient documentation

## 2022-06-02 LAB — CBC WITH DIFFERENTIAL/PLATELET
Abs Immature Granulocytes: 0.01 10*3/uL (ref 0.00–0.07)
Basophils Absolute: 0 10*3/uL (ref 0.0–0.1)
Basophils Relative: 1 %
Eosinophils Absolute: 0.3 10*3/uL (ref 0.0–0.5)
Eosinophils Relative: 4 %
HCT: 40 % (ref 36.0–46.0)
Hemoglobin: 12.7 g/dL (ref 12.0–15.0)
Immature Granulocytes: 0 %
Lymphocytes Relative: 33 %
Lymphs Abs: 2 10*3/uL (ref 0.7–4.0)
MCH: 26.2 pg (ref 26.0–34.0)
MCHC: 31.8 g/dL (ref 30.0–36.0)
MCV: 82.6 fL (ref 80.0–100.0)
Monocytes Absolute: 0.5 10*3/uL (ref 0.1–1.0)
Monocytes Relative: 8 %
Neutro Abs: 3.3 10*3/uL (ref 1.7–7.7)
Neutrophils Relative %: 54 %
Platelets: 368 10*3/uL (ref 150–400)
RBC: 4.84 MIL/uL (ref 3.87–5.11)
RDW: 15.9 % — ABNORMAL HIGH (ref 11.5–15.5)
WBC: 6 10*3/uL (ref 4.0–10.5)
nRBC: 0 % (ref 0.0–0.2)

## 2022-06-02 LAB — IRON AND TIBC
Iron: 38 ug/dL (ref 28–170)
Saturation Ratios: 12 % (ref 10.4–31.8)
TIBC: 327 ug/dL (ref 250–450)
UIBC: 289 ug/dL

## 2022-06-02 LAB — FERRITIN: Ferritin: 96 ng/mL (ref 11–307)

## 2022-06-02 NOTE — Progress Notes (Unsigned)
VIRTUAL VISIT via TELEPHONE NOTE [facility]   I connected with Diana [name]  on 06/03/22 at  1:52 PM by telephone and verified that I am speaking with the correct person using two identifiers.  Location: Patient: Home Provider: [facility]   I discussed the limitations, risks, security and privacy concerns of performing an evaluation and management service by telephone and the availability of in person appointments. I also discussed with the patient that there may be a patient responsible charge related to this service. The patient expressed understanding and agreed to proceed.  REASON FOR VISIT:  Follow-up for iron deficiency anemia  CURRENT THERAPY: Oral iron supplementation + IV iron as needed   INTERVAL HISTORY:     Diana [name] 37 y.o. female returns for routine follow-up of her iron deficiency anemia.  She was last seen by Rojelio Brenner PA-C on 03/04/2022.  At today's visit, she reports feeling fair.  No recent hospitalizations, surgeries, or changes in baseline health status.  She had some improved energy after her iron infusions in February 2024, but has had recurrent fatigue for the past month.  She continues to deal with heavy menstrual bleeding, and has been referred to gynecology with upcoming appointment in May 2024.  She denies any rectal bleeding or melena.  She denies any headaches, lightheadedness, and ice pica.  She denies any chest pain, dyspnea on exertion, or syncope.  She does note that she has had some palpitations for the past week.  She continues to take iron as prescribed without any major side effects.  She has 50% energy and 100% appetite. She endorses that she is maintaining a stable weight.  REVIEW OF SYSTEMS:   Review of Systems  Constitutional:  Negative for chills, diaphoresis, fever, malaise/fatigue and weight loss.  Respiratory:  Positive for shortness of breath (with exertion). Negative for cough.   Cardiovascular:   Positive for palpitations. Negative for chest pain.  Gastrointestinal:  Negative for abdominal pain, blood in stool, melena, nausea and vomiting.  Neurological:  Positive for tingling (left arm numbness). Negative for dizziness and headaches.  Psychiatric/Behavioral:  The patient is nervous/anxious and has insomnia.      PHYSICAL EXAM: (per limitations of virtual telephone visit)  The patient is alert and oriented x 3, exhibiting adequate mentation, good mood, and ability to speak in full sentences and execute sound judgement.  ASSESSMENT & PLAN:  1.  Iron deficiency anemia from chronic menstrual blood loss - She reports heavy menses and also had miscarriage earlier this year. - No rectal bleeding or melena. - She has been referred to gynecology due to menorrhagia.   - Failed to improve on oral iron supplementation alone. -Most recent IV venofer 300 mg x 3 doses in February 2024 - Most recent labs (06/02/2022): Hgb 12.7/MCV 82.6, ferritin 96, iron saturation 12% - Symptomatic with fatigue that is residual despite iron repletion - PLAN: No indication for IV iron at this time. - Continue daily ferrous sulfate 325 mg with vitamin C - Repeat labs and RTC in 3 months  - If she has persistent microcytic anemia despite normalization of iron, we will check hemoglobin electrophoresis (patient's mother has thalassemia trait)  2.  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  Non-smoker. - Mother has thalassemia trait.  No cancers in the family.  PLAN SUMMARY: >> Labs in 3 months = CBC/D, ferritin, iron/TIBC >> OFFICE visit in 3 months  I discussed the assessment and treatment plan with the patient. The patient was provided an opportunity to ask questions and all were answered. The patient agreed with the plan and demonstrated an understanding of the instructions.   The patient was advised to call back or seek an in-person evaluation if the  symptoms worsen or if the condition fails to improve as anticipated.  I provided 18 minutes of non-face-to-face time during this encounter.  Carnella Guadalajara, PA-C 06/03/22 2:09 PM

## 2022-06-03 ENCOUNTER — Inpatient Hospital Stay (HOSPITAL_BASED_OUTPATIENT_CLINIC_OR_DEPARTMENT_OTHER): Payer: 59 | Admitting: Physician Assistant

## 2022-06-03 ENCOUNTER — Other Ambulatory Visit: Payer: 59

## 2022-06-03 ENCOUNTER — Other Ambulatory Visit: Payer: Self-pay

## 2022-06-03 ENCOUNTER — Ambulatory Visit: Payer: 59 | Admitting: Physician Assistant

## 2022-06-03 DIAGNOSIS — D500 Iron deficiency anemia secondary to blood loss (chronic): Secondary | ICD-10-CM | POA: Diagnosis not present

## 2022-06-04 ENCOUNTER — Ambulatory Visit: Payer: 59 | Admitting: Family Medicine

## 2022-07-14 ENCOUNTER — Encounter: Payer: Self-pay | Admitting: Family Medicine

## 2022-08-27 ENCOUNTER — Inpatient Hospital Stay: Payer: 59

## 2022-09-03 ENCOUNTER — Inpatient Hospital Stay: Payer: 59 | Admitting: Physician Assistant

## 2022-10-14 ENCOUNTER — Inpatient Hospital Stay: Payer: 59 | Attending: Physician Assistant

## 2022-10-16 ENCOUNTER — Ambulatory Visit: Payer: 59 | Admitting: Family Medicine

## 2022-10-21 ENCOUNTER — Inpatient Hospital Stay: Payer: 59 | Admitting: Physician Assistant

## 2022-10-28 ENCOUNTER — Ambulatory Visit
Admission: RE | Admit: 2022-10-28 | Discharge: 2022-10-28 | Disposition: A | Discharge status: Home / Self Care | Payer: 59 | Source: Ambulatory Visit | Attending: Nurse Practitioner | Admitting: Nurse Practitioner

## 2022-10-28 VITALS — BP 157/91 | HR 88 | Temp 98.7°F | Resp 13

## 2022-10-28 DIAGNOSIS — M25511 Pain in right shoulder: Secondary | ICD-10-CM

## 2022-10-28 MED ORDER — METHOCARBAMOL 500 MG PO TABS: 500.0000 mg | ORAL_TABLET | Freq: Two times a day (BID) | ORAL | 0 refills | Status: DC

## 2022-10-28 MED ORDER — PREDNISONE 20 MG PO TABS: 40.0000 mg | ORAL_TABLET | Freq: Every day | ORAL | 0 refills | Status: AC

## 2022-10-28 NOTE — ED Triage Notes (Signed)
Pt c/o right shoulder pain radiating into the hand started Saturday evening, pt states she was unzipping her dress when she first noticed the pain in her arm.

## 2022-10-28 NOTE — Discharge Instructions (Signed)
Take medication as prescribed.  While taking the prednisone, it is recommended that you take Tylenol arthritis strength 650 mg tablets, take 1 tablet every 8 hours as needed for pain or discomfort. Continue the use of ice or heat.  Apply ice for pain or swelling, heat for spasm or stiffness.  Apply for 20 minutes, remove for 1 hour, repeat as needed. Perform the shoulder exercises provided at least 2-3 times daily while symptoms persist. Gentle range of motion exercises to help improve mobility and decrease recovery time. If symptoms fail to improve with this treatment, it is recommended that you follow-up with orthopedics for further evaluation.  You may follow-up with [facility] or with [facility] [place]. Follow-up as needed.

## 2022-10-28 NOTE — ED Provider Notes (Signed)
[facility]    CSN: [id number] Arrival date & time: 10/28/22  1807      History   Chief Complaint Chief Complaint  Patient presents with   Shoulder Pain    Shoulder pain that radiates all the way to my wrist. Not better with heat or ice. - Entered by patient    HPI Diana [name] is a 37 y.o. female.   The history is provided by the patient.   Patient presents for complaints of right shoulder pain that is been present for the past 3 days.  Patient states she was in zipping her dress when she noticed the pain afterwards.  Patient states initially, she did notice some numbness and tingling in the right hand.  She states that the pain continues to radiate into the right hand.  She states that she has not had any bruising, swelling, or redness in the right shoulder.  She states that she does have intermittent shooting pains from time to time.  Patient states repetitive motion makes her symptoms worse, along with range of motion or certain movement.  She does have full range of motion of the right arm and shoulder, but does experience pain in the upper portion of her right arm when doing so.  Patient reports that she has been taking Aleve and ibuprofen with minimal relief along with using ice or heat.  Patient denies any previous injury or trauma to the right arm or shoulder. Past Medical History:  Diagnosis Date   Asthma    Hypertension    Urticaria     Patient Active Problem List   Diagnosis Date Noted   Insomnia 03/05/2022   Anxiety and depression 02/04/2022   Iron deficiency anemia due to chronic blood loss 09/05/2021   Right thyroid nodule 01/10/2020   Asthma 11/04/2019   Allergic rhinitis 08/16/2018   Essential hypertension 07/17/2018    History reviewed. No pertinent surgical history.  OB History   No obstetric history on file.      Home Medications    Prior to Admission medications   Medication Sig Start Date End Date Taking? Authorizing Provider   methocarbamol (ROBAXIN) 500 MG tablet Take 1 tablet (500 mg total) by mouth 2 (two) times daily. 10/28/22  Yes Elmar Antigua-Warren, Sadie Haber, NP  predniSONE (DELTASONE) 20 MG tablet Take 2 tablets (40 mg total) by mouth daily with breakfast for 5 days. 10/28/22 11/02/22 Yes Anelis Hrivnak-Warren, Sadie Haber, NP  albuterol (VENTOLIN HFA) 108 (90 Base) MCG/ACT inhaler INHALE 2 PUFFS INTO THE LUNGS EVERY 4 HOURS AS NEEDED FOR WHEEZE OR FOR SHORTNESS OF BREATH 04/11/21   Ameduite, Alvino Chapel, FNP  alum & mag hydroxide-simeth (MYLANTA MAXIMUM STRENGTH) 400-400-40 MG/5ML suspension Take 15 mLs by mouth every 6 (six) hours as needed for indigestion. 03/12/22   Jaylissa Felty-Warren, Sadie Haber, NP  amLODipine (NORVASC) 10 MG tablet Take 1 tablet (10 mg total) by mouth daily. 01/15/22   Tommie Sams, DO  cetirizine (ZYRTEC) 10 MG tablet TAKE 1 TABLET BY MOUTH EVERY DAY 05/12/22   Tommie Sams, DO  Cholecalciferol (VITAMIN D3) 50 MCG (2000 UT) capsule TAKE 1 CAPSULE BY MOUTH EVERY DAY 06/12/21   [employer]med[employer]ite, Alvino Chapel, FNP  hydrochlorothiazide (HYDRODIURIL) 25 MG tablet Take 1 tablet (25 mg total) by mouth daily. 01/15/22   Everlene Other G, DO  ipratropium (ATROVENT) 0.06 % nasal spray Place 2 sprays into both nostrils 4 (four) times daily as needed for rhinitis. 04/15/22   Tommie Sams, DO  Iron, Ferrous Sulfate, 325 (65 Fe) MG TABS Take 325 mg by mouth daily. 03/05/22   Tommie Sams, DO  labetalol (NORMODYNE) 100 MG tablet Take 1 tablet (100 mg total) by mouth 2 (two) times daily. 01/15/22   Tommie Sams, DO  ondansetron (ZOFRAN-ODT) 4 MG disintegrating tablet Take 1 tablet (4 mg total) by mouth every 8 (eight) hours as needed for nausea or vomiting. Patient not taking: Reported on 06/03/2022 03/11/22   Waldon Merl, PA-C  pantoprazole (PROTONIX) 40 MG tablet Take 1 tablet (40 mg total) by mouth daily. 03/11/22   Waldon Merl, PA-C  potassium chloride SA (KLOR-CON M20) 20 MEQ tablet TAKE 1/2 TABLETS (10 MEQ TOTAL) BY MOUTH DAILY. 01/15/22    Tommie Sams, DO  Prenatal Vit-Fe Fumarate-FA (PRENATAL VITAMINS) 28-0.8 MG TABS 1 tablet daily 07/16/18   Salley Scarlet, MD  sertraline (ZOLOFT) 50 MG tablet TAKE 1 TABLET BY MOUTH EVERY DAY Patient taking differently: Take 50 mg by mouth as needed. 02/27/22   Tommie Sams, DO  traZODone (DESYREL) 50 MG tablet TAKE 0.5-1 TABLETS BY MOUTH AT BEDTIME AS NEEDED FOR SLEEP. 03/29/22   Tommie Sams, DO    Family History Family History  Problem Relation Age of Onset   Hypertension Mother    Hypertension Father    Asthma Sister    Allergic rhinitis Paternal Grandmother     Social History Social History   Tobacco Use   Smoking status: Never   Smokeless tobacco: Never  Vaping Use   Vaping status: Never Used  Substance Use Topics   Alcohol use: No   Drug use: No     Allergies   Dog epithelium (canis lupus familiaris), Gramineae pollens, Pseudoephedrine, and Amoxicillin   Review of Systems Review of Systems Per HPI   Physical Exam Triage Vital Signs ED Triage Vitals  Encounter Vitals Group     BP 10/28/22 1844 (!) 157/91     Systolic BP Percentile --      Diastolic BP Percentile --      Pulse Rate 10/28/22 1844 88     Resp 10/28/22 1844 13     Temp 10/28/22 1844 98.7 F (37.1 C)     Temp Source 10/28/22 1844 Oral     SpO2 10/28/22 1844 99 %     Weight --      Height --      Head Circumference --      Peak Flow --      Pain Score 10/28/22 1845 8     Pain Loc --      Pain Education --      Exclude from Growth Chart --    No data found.  Updated Vital Signs BP (!) 157/91 (BP Location: Right Arm)   Pulse 88   Temp 98.7 F (37.1 C) (Oral)   Resp 13   LMP 10/13/2022 (Within Days)   SpO2 99%   Visual Acuity Right Eye Distance:   Left Eye Distance:   Bilateral Distance:    Right Eye Near:   Left Eye Near:    Bilateral Near:     Physical Exam Vitals and nursing note reviewed.  Constitutional:      General: She is not in acute distress.     Appearance: Normal appearance.  HENT:     Head: Normocephalic.  Eyes:     Extraocular Movements: Extraocular movements intact.     Pupils: Pupils are equal, round, and reactive to  light.  Pulmonary:     Effort: Pulmonary effort is normal.  Musculoskeletal:     Right shoulder: No swelling, deformity or tenderness. Normal range of motion. Decreased strength. Normal pulse.     Cervical back: Normal range of motion.     Comments: Spasm and tenderness noted to the trapezius region of the right shoulder.  Lymphadenopathy:     Cervical: No cervical adenopathy.  Skin:    General: Skin is warm and dry.  Neurological:     General: No focal deficit present.     Mental Status: She is alert and oriented to person, place, and time.  Psychiatric:        Mood and Affect: Mood normal.        Behavior: Behavior normal.      UC Treatments / Results  Labs (all labs ordered are listed, but only abnormal results are displayed) Labs Reviewed - No data to display  EKG   Radiology No results found.  Procedures Procedures (including critical care time)  Medications Ordered in UC Medications - No data to display  Initial Impression / Assessment and Plan / UC Course  I have reviewed the triage vital signs and the nursing notes.  Pertinent labs & imaging results that were available during my care of the patient were reviewed by me and considered in my medical decision making (see chart for details).  Patient presents for complaints of right shoulder pain that started over the past 3 days.  On exam, patient has full range of motion.  She does have pain and tenderness in the right trapezius.  Pain also radiates into the right upper extremity.  Symptoms appear to be consistent with a muscle strain.  Will treat symptomatically with prednisone 40 mg for the next 5 days, and methocarbamol 500 mg for muscle spasticity.  Supportive care recommendations were provided and discussed with the patient to  include continuing over-the-counter analgesics, the use of ice or heat, and gentle range of motion exercises.  Patient advised to follow-up with orthopedics if symptoms fail to improve with this treatment.  Patient is in agreement with this plan of care and verbalizes understanding.  All questions were answered.  Patient stable for discharge.  Work note was provided.  Final Clinical Impressions(s) / UC Diagnoses   Final diagnoses:  Right shoulder pain, unspecified chronicity     Discharge Instructions      Take medication as prescribed.  While taking the prednisone, it is recommended that you take Tylenol arthritis strength 650 mg tablets, take 1 tablet every 8 hours as needed for pain or discomfort. Continue the use of ice or heat.  Apply ice for pain or swelling, heat for spasm or stiffness.  Apply for 20 minutes, remove for 1 hour, repeat as needed. Perform the shoulder exercises provided at least 2-3 times daily while symptoms persist. Gentle range of motion exercises to help improve mobility and decrease recovery time. If symptoms fail to improve with this treatment, it is recommended that you follow-up with orthopedics for further evaluation.  You may follow-up with [facility] or with [facility] [place]. Follow-up as needed.      ED Prescriptions     Medication Sig Dispense Auth. Provider   predniSONE (DELTASONE) 20 MG tablet Take 2 tablets (40 mg total) by mouth daily with breakfast for 5 days. 10 tablet Tamiah Dysart-Warren, Sadie Haber, NP   methocarbamol (ROBAXIN) 500 MG tablet Take 1 tablet (500 mg total) by mouth 2 (two) times daily.  20 tablet Mckaylee Dimalanta-Warren, Sadie Haber, NP      PDMP not reviewed this encounter.   Abran Cantor, NP 10/28/22 [phone]

## 2022-11-05 ENCOUNTER — Ambulatory Visit
Admission: RE | Admit: 2022-11-05 | Discharge: 2022-11-05 | Disposition: A | Discharge status: Home / Self Care | Payer: 59 | Source: Ambulatory Visit | Attending: Nurse Practitioner | Admitting: Nurse Practitioner

## 2022-11-05 VITALS — BP 132/90 | HR 104 | Temp 98.1°F | Resp 18

## 2022-11-05 DIAGNOSIS — S46911A Strain of unspecified muscle, fascia and tendon at shoulder and upper arm level, right arm, initial encounter: Secondary | ICD-10-CM | POA: Diagnosis not present

## 2022-11-05 MED ORDER — KETOROLAC TROMETHAMINE 30 MG/ML IJ SOLN
30.0000 mg | Freq: Once | INTRAMUSCULAR | Status: AC
  Administered 2022-11-05: 30 mg via INTRAMUSCULAR

## 2022-11-05 MED ORDER — TIZANIDINE HCL 4 MG PO TABS: 4.0000 mg | ORAL_TABLET | Freq: Three times a day (TID) | ORAL | 0 refills | Status: DC | PRN

## 2022-11-05 NOTE — Discharge Instructions (Addendum)
We have given you an injection of Toradol today to help with pain and inflammation in your right shoulder.  In addition, you can start taking the tizanidine every 8 hours as needed to help with muscular pain.  Start using the shoulder exercises that have attached in this packet.  If symptoms are not improved, recommend follow-up with sports medicine specialist; contact information has been provided.

## 2022-11-05 NOTE — ED Provider Notes (Signed)
[facility]    CSN: [id number] Arrival date & time: 11/05/22  1532      History   Chief Complaint Chief Complaint  Patient presents with   Shoulder Pain    Entered by patient    HPI Diana [name] is a 37 y.o. female.   Patient presents today with 2 week history of right shoulder pain.  Reports the pain began after unzipping her dress after an event.  Reports the pain is in the top of her shoulder and is also along her upper back.  Pain occasionally causes numbness/tingling in the fingers.  Has pain with morvement of the shoulder, no pain with neck movement, elbow, wrist, or hand movement.  No recent fall or trauma or injury to the right shoulder.  Patient was seen on 10/28/22 and was treated with oral steroids, Robaxin, and took all of the steroid without much improvement.  Reports pain is somewhat improved with use of a heating pad.  Ice makes the pain worse.  Patient denies decreased grip strength of the right upper extremity, weakness of the right upper extremity.  No swelling, bruising, or redness of the shoulder. No fevers or nausea/vomiting since the pain began.     Past Medical History:  Diagnosis Date   Asthma    Hypertension    Urticaria     Patient Active Problem List   Diagnosis Date Noted   Insomnia 03/05/2022   Anxiety and depression 02/04/2022   Iron deficiency anemia due to chronic blood loss 09/05/2021   Right thyroid nodule 01/10/2020   Asthma 11/04/2019   Allergic rhinitis 08/16/2018   Essential hypertension 07/17/2018    History reviewed. No pertinent surgical history.  OB History   No obstetric history on file.      Home Medications    Prior to Admission medications   Medication Sig Start Date End Date Taking? Authorizing Provider  tiZANidine (ZANAFLEX) 4 MG tablet Take 1 tablet (4 mg total) by mouth every 8 (eight) hours as needed for muscle spasms. Do not take with alcohol or while driving or operating heavy machinery.  May cause  drowsiness. 11/05/22  Yes Cathlean Marseilles A, NP  albuterol (VENTOLIN HFA) 108 (90 Base) MCG/ACT inhaler INHALE 2 PUFFS INTO THE LUNGS EVERY 4 HOURS AS NEEDED FOR WHEEZE OR FOR SHORTNESS OF BREATH 04/11/21   [employer], Alvino Chapel, FNP  alum & mag hydroxide-simeth (MYLANTA MAXIMUM STRENGTH) 400-400-40 MG/5ML suspension Take 15 mLs by mouth every 6 (six) hours as needed for indigestion. 03/12/22   Leath-Warren, Sadie Haber, NP  amLODipine (NORVASC) 10 MG tablet Take 1 tablet (10 mg total) by mouth daily. 01/15/22   Tommie Sams, DO  cetirizine (ZYRTEC) 10 MG tablet TAKE 1 TABLET BY MOUTH EVERY DAY 05/12/22   Tommie Sams, DO  Cholecalciferol (VITAMIN D3) 50 MCG (2000 UT) capsule TAKE 1 CAPSULE BY MOUTH EVERY DAY 06/12/21   [employer], Alvino Chapel, FNP  hydrochlorothiazide (HYDRODIURIL) 25 MG tablet Take 1 tablet (25 mg total) by mouth daily. 01/15/22   Everlene Other G, DO  ipratropium (ATROVENT) 0.06 % nasal spray Place 2 sprays into both nostrils 4 (four) times daily as needed for rhinitis. 04/15/22   Tommie Sams, DO  Iron, Ferrous Sulfate, 325 (65 Fe) MG TABS Take 325 mg by mouth daily. 03/05/22   Tommie Sams, DO  labetalol (NORMODYNE) 100 MG tablet Take 1 tablet (100 mg total) by mouth 2 (two) times daily. 01/15/22   Tommie Sams, DO  ondansetron (ZOFRAN-ODT) 4 MG disintegrating tablet Take 1 tablet (4 mg total) by mouth every 8 (eight) hours as needed for nausea or vomiting. Patient not taking: Reported on 06/03/2022 03/11/22   Waldon Merl, PA-C  pantoprazole (PROTONIX) 40 MG tablet Take 1 tablet (40 mg total) by mouth daily. 03/11/22   Waldon Merl, PA-C  potassium chloride SA (KLOR-CON M20) 20 MEQ tablet TAKE 1/2 TABLETS (10 MEQ TOTAL) BY MOUTH DAILY. 01/15/22   Tommie Sams, DO  Prenatal Vit-Fe Fumarate-FA (PRENATAL VITAMINS) 28-0.8 MG TABS 1 tablet daily 07/16/18   Salley Scarlet, MD  sertraline (ZOLOFT) 50 MG tablet TAKE 1 TABLET BY MOUTH EVERY DAY Patient taking differently: Take 50 mg by mouth  as needed. 02/27/22   Tommie Sams, DO  traZODone (DESYREL) 50 MG tablet TAKE 0.5-1 TABLETS BY MOUTH AT BEDTIME AS NEEDED FOR SLEEP. 03/29/22   Tommie Sams, DO    Family History Family History  Problem Relation Age of Onset   Hypertension Mother    Hypertension Father    Asthma Sister    Allergic rhinitis Paternal Grandmother     Social History Social History   Tobacco Use   Smoking status: Never   Smokeless tobacco: Never  Vaping Use   Vaping status: Never Used  Substance Use Topics   Alcohol use: No   Drug use: No     Allergies   Dog epithelium (canis lupus familiaris), Gramineae pollens, Pseudoephedrine, and Amoxicillin   Review of Systems Review of Systems Per HPI  Physical Exam Triage Vital Signs ED Triage Vitals  Encounter Vitals Group     BP 11/05/22 1540 (!) 132/90     Systolic BP Percentile --      Diastolic BP Percentile --      Pulse Rate 11/05/22 1540 (!) 104     Resp 11/05/22 1540 18     Temp 11/05/22 1540 98.1 F (36.7 C)     Temp Source 11/05/22 1540 Oral     SpO2 11/05/22 1540 98 %     Weight --      Height --      Head Circumference --      Peak Flow --      Pain Score 11/05/22 1542 9     Pain Loc --      Pain Education --      Exclude from Growth Chart --    No data found.  Updated Vital Signs BP (!) 132/90 (BP Location: Right Arm)   Pulse (!) 104   Temp 98.1 F (36.7 C) (Oral)   Resp 18   LMP 10/13/2022 (Within Days)   SpO2 98%   Visual Acuity Right Eye Distance:   Left Eye Distance:   Bilateral Distance:    Right Eye Near:   Left Eye Near:    Bilateral Near:     Physical Exam Vitals and nursing note reviewed.  Constitutional:      General: She is not in acute distress.    Appearance: Normal appearance. She is not toxic-appearing.  HENT:     Mouth/Throat:     Mouth: Mucous membranes are moist.     Pharynx: Oropharynx is clear.  Pulmonary:     Effort: Pulmonary effort is normal. No respiratory distress.   Musculoskeletal:     Comments: Inspection: no swelling, bruising, obvious deformity or redness to right upper extremity Palpation: tender to palpation diffusely to right upper trapezius and para scapular muscles; no obvious deformities  palpated ROM: Full ROM to right upper extremity, pain with passive ROM of right shoulder Strength: 5/5 bilateral upper extremities Neurovascular: neurovascularly intact in bilateral upper extremities  Skin:    General: Skin is warm and dry.     Capillary Refill: Capillary refill takes less than 2 seconds.     Coloration: Skin is not jaundiced or pale.     Findings: No erythema.  Neurological:     Mental Status: She is alert and oriented to person, place, and time.  Psychiatric:        Behavior: Behavior is cooperative.      UC Treatments / Results  Labs (all labs ordered are listed, but only abnormal results are displayed) Labs Reviewed - No data to display  EKG   Radiology No results found.  Procedures Procedures (including critical care time)  Medications Ordered in UC Medications  ketorolac (TORADOL) 30 MG/ML injection 30 mg (30 mg Intramuscular Given 11/05/22 1602)    Initial Impression / Assessment and Plan / UC Course  I have reviewed the triage vital signs and the nursing notes.  Pertinent labs & imaging results that were available during my care of the patient were reviewed by me and considered in my medical decision making (see chart for details).   Patient is well-appearing, normotensive, afebrile, not tachycardic, not tachypneic, oxygenating well on room air.    1. Strain of right shoulder, initial encounter No red flags in history or on examination Treat with ongoing muscle relaxant, injection of Toradol given today in urgent care for pain control Start ROM/stretching exercises Follow up with Orthopedic provider with no improvement/worsening or symptoms despite treatment and contact information given today.  The patient  was given the opportunity to ask questions.  All questions answered to their satisfaction.  The patient is in agreement to this plan.    Final Clinical Impressions(s) / UC Diagnoses   Final diagnoses:  Strain of right shoulder, initial encounter     Discharge Instructions      We have given you an injection of Toradol today to help with pain and inflammation in your right shoulder.  In addition, you can start taking the tizanidine every 8 hours as needed to help with muscular pain.  Start using the shoulder exercises that have attached in this packet.  If symptoms are not improved, recommend follow-up with sports medicine specialist; contact information has been provided.     ED Prescriptions     Medication Sig Dispense Auth. Provider   tiZANidine (ZANAFLEX) 4 MG tablet Take 1 tablet (4 mg total) by mouth every 8 (eight) hours as needed for muscle spasms. Do not take with alcohol or while driving or operating heavy machinery.  May cause drowsiness. 30 tablet Valentino Nose, NP      PDMP not reviewed this encounter.   Valentino Nose, NP 11/05/22 2021

## 2022-11-05 NOTE — ED Triage Notes (Signed)
Pt c/o right shoulder pain, pt was seen previously on 10/28/2022, pt is wanting x-ray. Pt states she is taking what has been prescribed but it has not made a difference.

## 2022-11-07 ENCOUNTER — Ambulatory Visit (INDEPENDENT_AMBULATORY_CARE_PROVIDER_SITE_OTHER): Payer: 59 | Admitting: Family Medicine

## 2022-11-07 ENCOUNTER — Other Ambulatory Visit: Payer: Self-pay | Admitting: Family Medicine

## 2022-11-07 ENCOUNTER — Encounter: Payer: Self-pay | Admitting: *Deleted

## 2022-11-07 ENCOUNTER — Encounter: Payer: Self-pay | Admitting: Emergency Medicine

## 2022-11-07 VITALS — BP 130/84 | Ht 62.0 in | Wt 163.4 lb

## 2022-11-07 DIAGNOSIS — S4991XS Unspecified injury of right shoulder and upper arm, sequela: Secondary | ICD-10-CM

## 2022-11-07 DIAGNOSIS — Z0000 Encounter for general adult medical examination without abnormal findings: Secondary | ICD-10-CM

## 2022-11-07 DIAGNOSIS — Z0001 Encounter for general adult medical examination with abnormal findings: Secondary | ICD-10-CM | POA: Diagnosis not present

## 2022-11-07 MED ORDER — SEMAGLUTIDE-WEIGHT MANAGEMENT 0.25 MG/0.5ML ~~LOC~~ SOAJ: 0.2500 mg | SUBCUTANEOUS | 0 refills | Status: DC

## 2022-11-07 NOTE — Patient Instructions (Signed)
Medications sent.  Follow up in 6 months.  Take care  Dr. Adriana Simas

## 2022-11-09 DIAGNOSIS — Z0000 Encounter for general adult medical examination without abnormal findings: Secondary | ICD-10-CM | POA: Insufficient documentation

## 2022-11-09 NOTE — Assessment & Plan Note (Signed)
Health maintenance up dated. Will see if Reginal Lutes is covered.  Referring to ortho regarding shoulder.

## 2022-11-09 NOTE — Progress Notes (Signed)
Subjective:  Patient ID: Diana [name], female    DOB: 22-Sep-1985  Age: 37 y.o. MRN: [id number]  CC: Chief Complaint  Patient presents with   Annual Exam    Gets female exam through GYN No problems or concerns    HPI:  37 year old female presents for an annual exam.  Doing well overall. Pap smear up to date. Has had HIV screening.   She is quite concerned about weight gain and difficulty losing weight. Will discuss today.  Also has had ongoing shoulder pain (right). Occurred after reaching back to zip up a dress recently.   Patient Active Problem List   Diagnosis Date Noted   Annual physical exam 11/09/2022   Insomnia 03/05/2022   Anxiety and depression 02/04/2022   Iron deficiency anemia due to chronic blood loss 09/05/2021   Right thyroid nodule 01/10/2020   Asthma 11/04/2019   Allergic rhinitis 08/16/2018   Essential hypertension 07/17/2018    Social Hx   Social History   Socioeconomic History   Marital status: Single    Spouse name: Not on file   Number of children: Not on file   Years of education: Not on file   Highest education level: Not on file  Occupational History   Not on file  Tobacco Use   Smoking status: Never   Smokeless tobacco: Never  Vaping Use   Vaping status: Never Used  Substance and Sexual Activity   Alcohol use: No   Drug use: No   Sexual activity: Not on file  Other Topics Concern   Not on file  Social History Narrative   Not on file   Social Determinants of Health   Financial Resource Strain: Not on file  Food Insecurity: Not on file  Transportation Needs: Not on file  Physical Activity: Not on file  Stress: Not on file  Social Connections: Not on file    Review of Systems Per HPI  Objective:  BP 130/84   Ht 5\' 2"  (1.575 m)   Wt 163 lb 6.4 oz (74.1 kg)   LMP 10/13/2022 (Within Days)   BMI 29.89 kg/m      11/07/2022    2:17 PM 11/05/2022    3:40 PM 10/28/2022    6:44 PM  BP/Weight  Systolic BP 130 132 157   Diastolic BP 84 90 91  Wt. (Lbs) 163.4    BMI 29.89 kg/m2      Physical Exam Vitals and nursing note reviewed.  Constitutional:      General: She is not in acute distress.    Appearance: Normal appearance.  HENT:     Head: Normocephalic and atraumatic.  Eyes:     General:        Right eye: No discharge.        Left eye: No discharge.     Conjunctiva/sclera: Conjunctivae normal.  Cardiovascular:     Rate and Rhythm: Normal rate and regular rhythm.  Pulmonary:     Effort: Pulmonary effort is normal.     Breath sounds: Normal breath sounds. No wheezing, rhonchi or rales.  Neurological:     Mental Status: She is alert.  Psychiatric:        Mood and Affect: Mood normal.        Behavior: Behavior normal.     Lab Results  Component Value Date   WBC 6.0 06/02/2022   HGB 12.7 06/02/2022   HCT 40.0 06/02/2022   PLT 368 06/02/2022   GLUCOSE 98 09/16/2021  CHOL 185 04/11/2021   TRIG 96 04/11/2021   HDL 55 04/11/2021   LDLCALC 113 (H) 04/11/2021   ALT 18 05/17/2021   AST 14 05/17/2021   NA 136 09/16/2021   K 3.4 (L) 09/16/2021   CL 104 09/16/2021   CREATININE 0.61 09/16/2021   BUN 8 09/16/2021   CO2 24 09/16/2021   TSH 0.815 04/11/2021   HGBA1C 5.3 06/22/2020     Assessment & Plan:   Problem List Items Addressed This Visit       Other   Annual physical exam - Primary    Health maintenance up dated. Will see if Reginal Lutes is covered.  Referring to ortho regarding shoulder.       Other Visit Diagnoses     Injury of right shoulder, sequela       Relevant Orders   Ambulatory referral to Orthopedic Surgery       Meds ordered this encounter  Medications   Semaglutide-Weight Management 0.25 MG/0.5ML SOAJ    Sig: Inject 0.25 mg into the skin once a week.    Dispense:  2 mL    Refill:  0    Follow-up:  6 months  Laci Frenkel Adriana Simas DO [facility]

## 2022-11-10 ENCOUNTER — Encounter: Payer: Self-pay | Admitting: Family Medicine

## 2022-11-11 NOTE — Telephone Encounter (Addendum)
      Per Patient's Insurance - Anti Obesity medications of all types are a plan exclusion and not covered     Tommie Sams, DO     Please inform patient.

## 2022-11-13 ENCOUNTER — Other Ambulatory Visit: Payer: Self-pay | Admitting: Family Medicine

## 2023-02-02 IMAGING — DX DG CERVICAL SPINE COMPLETE 4+V
6 series · 6 of 6 positions shown · non-contrast
Comparison: None.

CLINICAL DATA: Left arm numbness, left-sided neck pain

EXAM:
CERVICAL SPINE - COMPLETE 4+ VIEW

[c-spine lat]
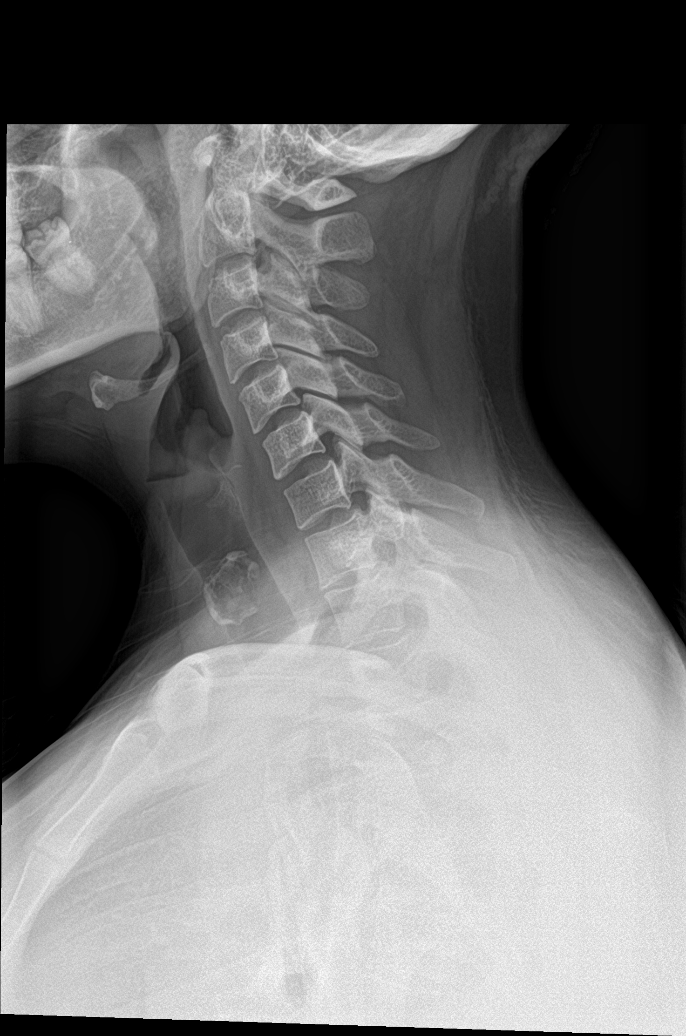

[c-spine obl (1 of 2)]
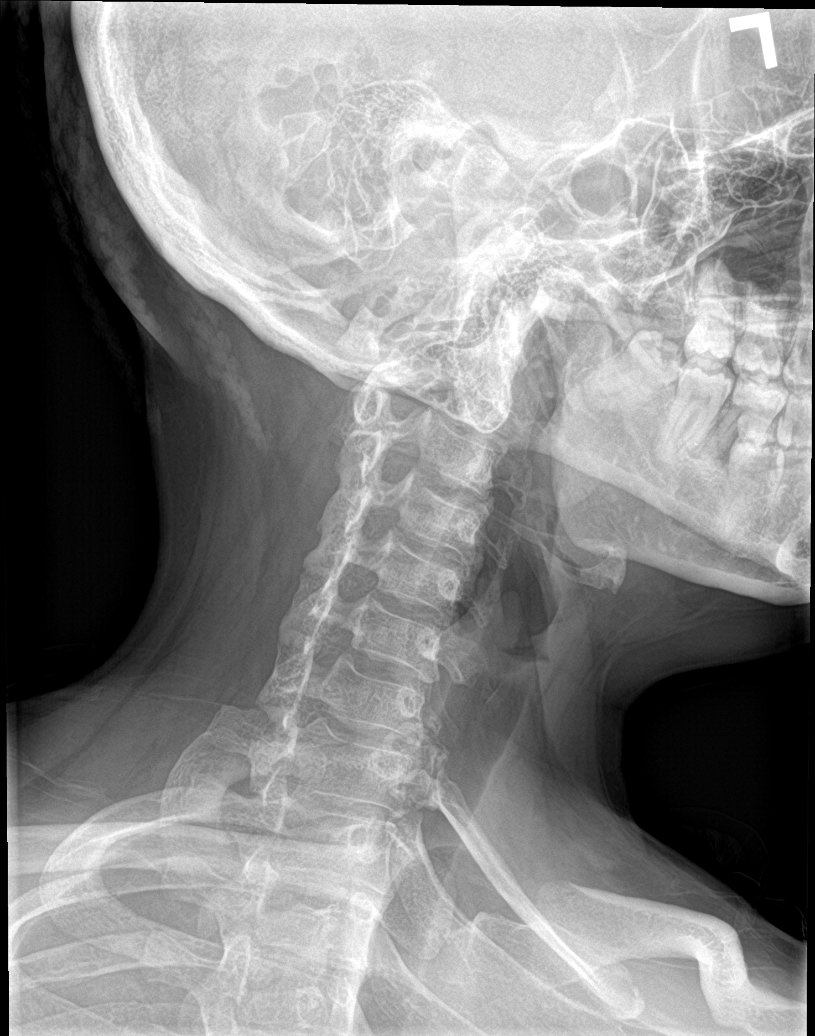

[c-spine obl (2 of 2)]
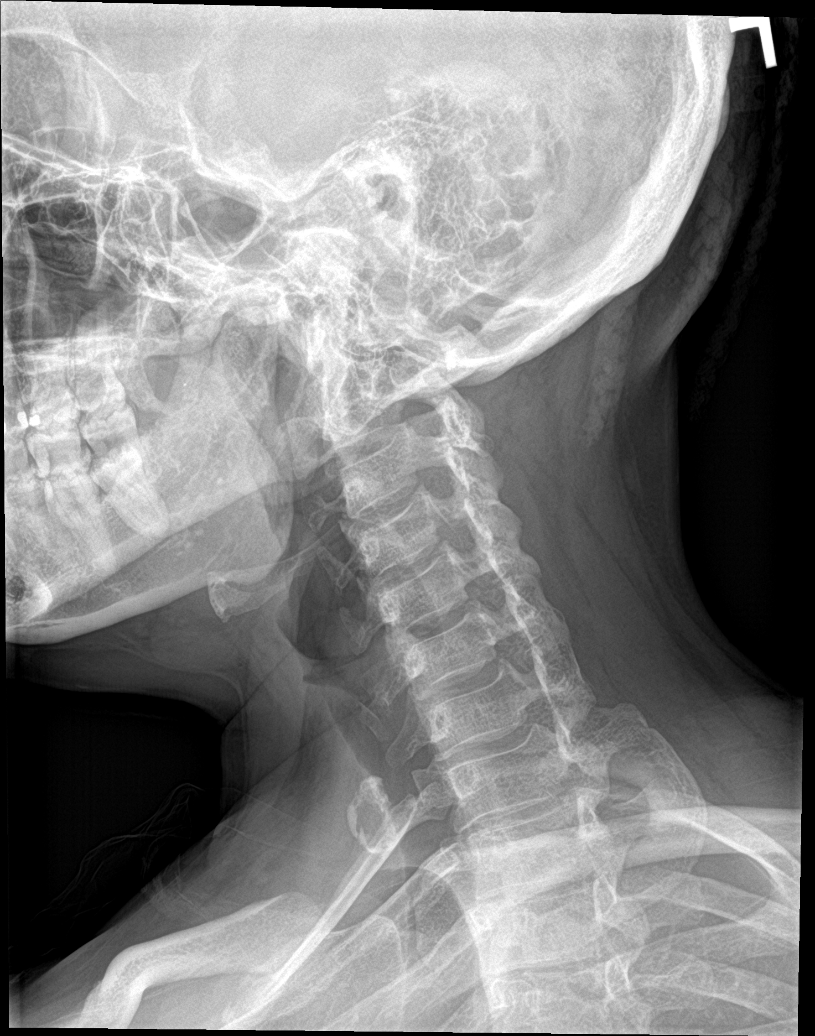

[c-spine ap]
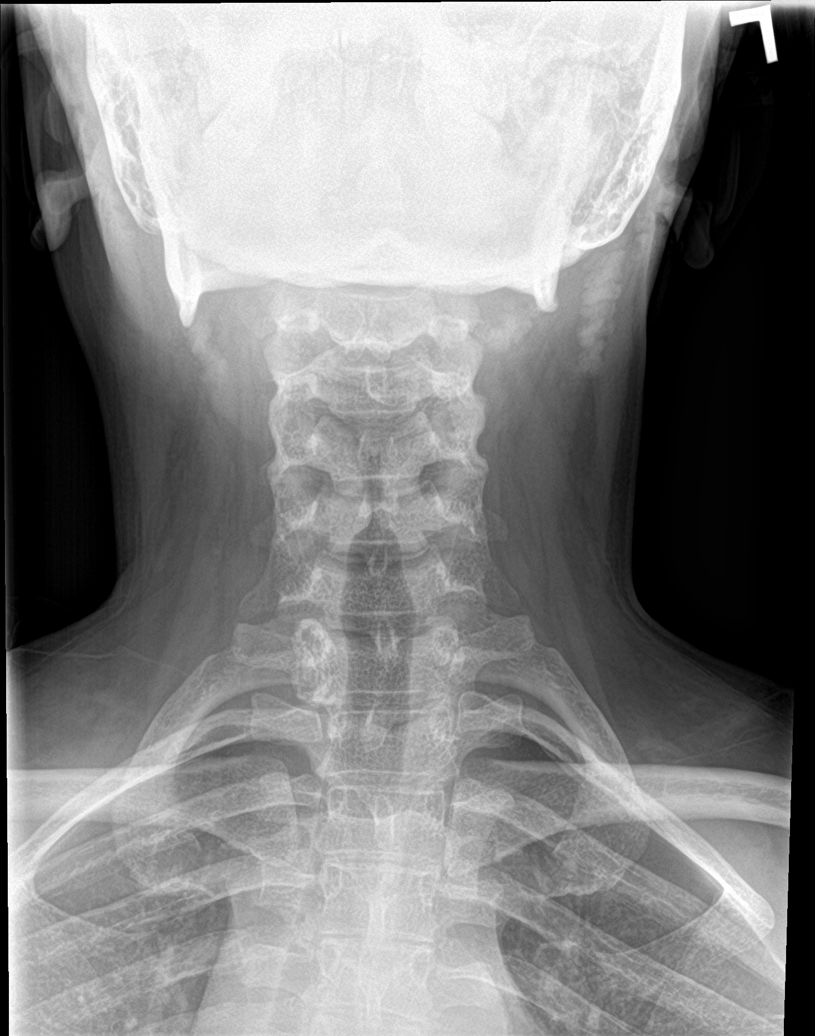

[c-spine open mouth (1 of 2)]
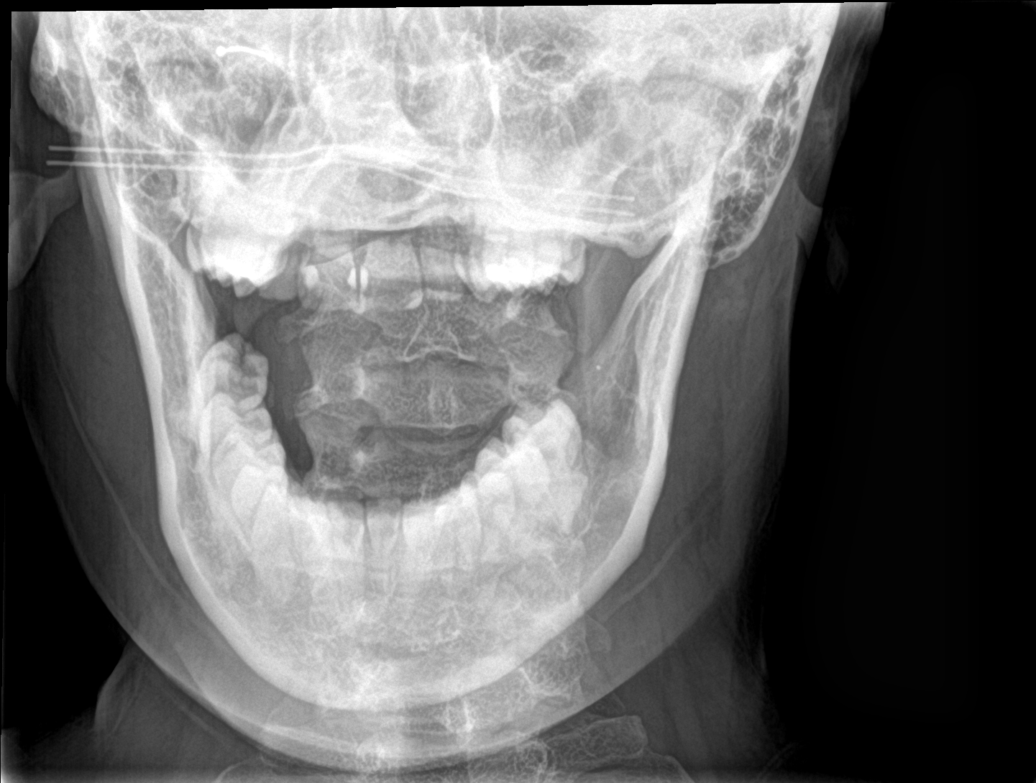

[c-spine open mouth (2 of 2)]
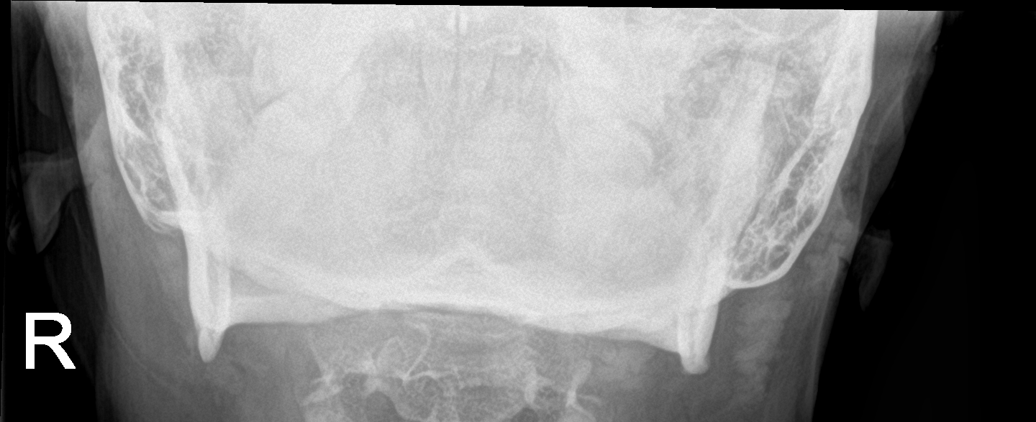

[6 of 6 positions shown; findings below may reference images not displayed]

FINDINGS: Cervical spine is visualized to the level of T1.

Vertebral body heights are maintained. Alignment is anatomic.
Prevertebral soft tissues are normal. No acute fracture.

Disc spaces are maintained. Bilateral neural foramina are patent.
IMPRESSION: 1. Normal cervical spine.

## 2023-03-08 ENCOUNTER — Ambulatory Visit: Payer: 59

## 2023-03-18 ENCOUNTER — Ambulatory Visit
Admission: RE | Admit: 2023-03-18 | Discharge: 2023-03-18 | Disposition: A | Discharge status: Home / Self Care | Payer: 59 | Source: Ambulatory Visit | Attending: Family Medicine | Admitting: Family Medicine

## 2023-03-18 VITALS — BP 168/98 | HR 101 | Temp 97.6°F | Resp 16

## 2023-03-18 DIAGNOSIS — J069 Acute upper respiratory infection, unspecified: Secondary | ICD-10-CM | POA: Diagnosis not present

## 2023-03-18 LAB — POC COVID19/FLU A&B COMBO
Covid Antigen, POC: NEGATIVE
Influenza A Antigen, POC: NEGATIVE
Influenza B Antigen, POC: NEGATIVE

## 2023-03-18 MED ORDER — FLUTICASONE PROPIONATE 50 MCG/ACT NA SUSP: 1.0000 | Freq: Two times a day (BID) | NASAL | 2 refills | Status: DC

## 2023-03-18 MED ORDER — BENZONATATE 200 MG PO CAPS: 200.0000 mg | ORAL_CAPSULE | Freq: Three times a day (TID) | ORAL | 0 refills | Status: DC | PRN

## 2023-03-18 NOTE — ED Triage Notes (Signed)
Here for sinus pressure and headache x 3 days. Patient denies any fever,chill. Nausea or diarrhea.

## 2023-03-18 NOTE — ED Provider Notes (Signed)
[facility]    CSN: [id number] Arrival date & time: 03/18/23  1809      History   Chief Complaint Chief Complaint  Patient presents with   Nasal Congestion    Sinus pressure - Entered by patient    HPI Diana [name] is a 38 y.o. female.   Today with 3-day history of sinus pressure, headache, congestion, cough, chills, diarrhea, nausea.  So far trying ibuprofen with minimal relief.  Multiple sick contacts recently.  History of asthma not currently on any inhalers.    Past Medical History:  Diagnosis Date   Asthma    Hypertension    Urticaria     Patient Active Problem List   Diagnosis Date Noted   Annual physical exam 11/09/2022   Insomnia 03/05/2022   Anxiety and depression 02/04/2022   Iron deficiency anemia due to chronic blood loss 09/05/2021   Right thyroid nodule 01/10/2020   Asthma 11/04/2019   Allergic rhinitis 08/16/2018   Essential hypertension 07/17/2018    History reviewed. No pertinent surgical history.  OB History   No obstetric history on file.      Home Medications    Prior to Admission medications   Medication Sig Start Date End Date Taking? Authorizing Provider  amLODipine (NORVASC) 10 MG tablet Take 1 tablet (10 mg total) by mouth daily. 01/15/22  Yes Cook, Jayce G, DO  benzonatate (TESSALON) 200 MG capsule Take 1 capsule (200 mg total) by mouth 3 (three) times daily as needed for cough. 03/18/23  Yes Particia Nearing, PA-C  cetirizine (ZYRTEC) 10 MG tablet TAKE 1 TABLET BY MOUTH EVERY DAY 05/12/22  Yes Cook, Jayce G, DO  fluticasone (FLONASE) 50 MCG/ACT nasal spray Place 1 spray into both nostrils 2 (two) times daily. 03/18/23  Yes Particia Nearing, PA-C  Iron, Ferrous Sulfate, 325 (65 Fe) MG TABS Take 325 mg by mouth daily. 03/05/22  Yes Cook, Jayce G, DO  labetalol (NORMODYNE) 100 MG tablet TAKE 1 TABLET BY MOUTH TWICE A DAY 11/13/22  Yes Cook, Jayce G, DO  potassium chloride SA (KLOR-CON M20) 20 MEQ tablet TAKE 1/2  TABLETS (10 MEQ TOTAL) BY MOUTH DAILY. 01/15/22  Yes Tommie Sams, DO  Prenatal Vit-Fe Fumarate-FA (PRENATAL VITAMINS) 28-0.8 MG TABS 1 tablet daily 07/16/18  Yes [place], Velna Hatchet, MD  sertraline (ZOLOFT) 50 MG tablet TAKE 1 TABLET BY MOUTH EVERY DAY Patient taking differently: Take 50 mg by mouth as needed. 02/27/22  Yes Cook, Jayce G, DO  traZODone (DESYREL) 50 MG tablet TAKE 0.5-1 TABLETS BY MOUTH AT BEDTIME AS NEEDED FOR SLEEP. 03/29/22  Yes Cook, Jayce G, DO  Cholecalciferol (VITAMIN D3) 50 MCG (2000 UT) capsule TAKE 1 CAPSULE BY MOUTH EVERY DAY 06/12/21   [employer]ite, Alvino Chapel, FNP  hydrochlorothiazide (HYDRODIURIL) 25 MG tablet Take 1 tablet (25 mg total) by mouth daily. 01/15/22   Everlene Other G, DO  ipratropium (ATROVENT) 0.06 % nasal spray Place 2 sprays into both nostrils 4 (four) times daily as needed for rhinitis. 04/15/22   Tommie Sams, DO  pantoprazole (PROTONIX) 40 MG tablet Take 1 tablet (40 mg total) by mouth daily. 03/11/22   Waldon Merl, PA-C  Semaglutide-Weight Management 0.25 MG/0.5ML SOAJ Inject 0.25 mg into the skin once a week. 11/07/22   Tommie Sams, DO  tiZANidine (ZANAFLEX) 4 MG tablet Take 1 tablet (4 mg total) by mouth every 8 (eight) hours as needed for muscle spasms. Do not take with alcohol or while  driving or operating heavy machinery.  May cause drowsiness. 11/05/22   Valentino Nose, NP    Family History Family History  Problem Relation Age of Onset   Hypertension Mother    Hypertension Father    Asthma Sister    Allergic rhinitis Paternal Grandmother     Social History Social History   Tobacco Use   Smoking status: Never   Smokeless tobacco: Never  Vaping Use   Vaping status: Never Used  Substance Use Topics   Alcohol use: No   Drug use: No     Allergies   Dog epithelium (canis lupus familiaris), Gramineae pollens, Pseudoephedrine, and Amoxicillin   Review of Systems Review of Systems Per HPI  Physical Exam Triage Vital Signs ED  Triage Vitals [03/18/23 1831]  Encounter Vitals Group     BP (!) 168/98     Systolic BP Percentile      Diastolic BP Percentile      Pulse Rate (!) 101     Resp 16     Temp 97.6 F (36.4 C)     Temp Source Oral     SpO2 98 %     Weight      Height      Head Circumference      Peak Flow      Pain Score      Pain Loc      Pain Education      Exclude from Growth Chart    No data found.  Updated Vital Signs BP (!) 168/98 (BP Location: Left Arm)   Pulse (!) 101   Temp 97.6 F (36.4 C) (Oral)   Resp 16   SpO2 98%   Visual Acuity Right Eye Distance:   Left Eye Distance:   Bilateral Distance:    Right Eye Near:   Left Eye Near:    Bilateral Near:     Physical Exam Vitals and nursing note reviewed.  Constitutional:      Appearance: Normal appearance.  HENT:     Head: Atraumatic.     Right Ear: Tympanic membrane and external ear normal.     Left Ear: Tympanic membrane and external ear normal.     Nose: Rhinorrhea present.     Mouth/Throat:     Mouth: Mucous membranes are moist.     Pharynx: Posterior oropharyngeal erythema present.  Eyes:     Extraocular Movements: Extraocular movements intact.     Conjunctiva/sclera: Conjunctivae normal.  Cardiovascular:     Rate and Rhythm: Normal rate and regular rhythm.     Heart sounds: Normal heart sounds.  Pulmonary:     Effort: Pulmonary effort is normal.     Breath sounds: Normal breath sounds. No wheezing or rales.  Musculoskeletal:        General: Normal range of motion.     Cervical back: Normal range of motion and neck supple.  Skin:    General: Skin is warm and dry.  Neurological:     Mental Status: She is alert and oriented to person, place, and time.  Psychiatric:        Mood and Affect: Mood normal.        Thought Content: Thought content normal.      UC Treatments / Results  Labs (all labs ordered are listed, but only abnormal results are displayed) Labs Reviewed  POC COVID19/FLU A&B COMBO     EKG   Radiology No results found.  Procedures Procedures (including critical care time)  Medications Ordered in UC Medications - No data to display  Initial Impression / Assessment and Plan / UC Course  I have reviewed the triage vital signs and the nursing notes.  Pertinent labs & imaging results that were available during my care of the patient were reviewed by me and considered in my medical decision making (see chart for details).     Vitals and exam overall reassuring today, suspect viral upper respiratory infection.  Rapid flu and COVID-negative, treat symptomatically with Tessalon, Flonase, supportive over-the-counter medications and home care.  Work note given.  Final Clinical Impressions(s) / UC Diagnoses   Final diagnoses:  Viral URI with cough   Discharge Instructions   None    ED Prescriptions     Medication Sig Dispense Auth. Provider   fluticasone (FLONASE) 50 MCG/ACT nasal spray Place 1 spray into both nostrils 2 (two) times daily. 16 g Particia Nearing, PA-C   benzonatate (TESSALON) 200 MG capsule Take 1 capsule (200 mg total) by mouth 3 (three) times daily as needed for cough. 20 capsule Particia Nearing, [place]      PDMP not reviewed this encounter.   Particia Nearing, [place] 03/18/23 [phone]

## 2023-03-20 ENCOUNTER — Encounter: Payer: Self-pay | Admitting: Family Medicine

## 2023-03-21 ENCOUNTER — Other Ambulatory Visit: Payer: Self-pay | Admitting: Family Medicine

## 2023-03-21 MED ORDER — DOXYCYCLINE HYCLATE 100 MG PO TABS: 100.0000 mg | ORAL_TABLET | Freq: Two times a day (BID) | ORAL | 0 refills | Status: DC

## 2023-03-30 ENCOUNTER — Encounter: Payer: Self-pay | Admitting: Family Medicine

## 2023-03-30 ENCOUNTER — Other Ambulatory Visit: Payer: Self-pay | Admitting: Family Medicine

## 2023-03-30 MED ORDER — ALBUTEROL SULFATE HFA 108 (90 BASE) MCG/ACT IN AERS: 2.0000 | INHALATION_SPRAY | Freq: Four times a day (QID) | RESPIRATORY_TRACT | 0 refills | Status: DC | PRN

## 2023-04-20 ENCOUNTER — Telehealth: Admitting: Nurse Practitioner

## 2023-04-20 ENCOUNTER — Encounter

## 2023-04-20 DIAGNOSIS — J40 Bronchitis, not specified as acute or chronic: Secondary | ICD-10-CM | POA: Diagnosis not present

## 2023-04-20 MED ORDER — PROMETHAZINE-DM 6.25-15 MG/5ML PO SYRP: 5.0000 mL | ORAL_SOLUTION | Freq: Four times a day (QID) | ORAL | 0 refills | Status: DC | PRN

## 2023-04-20 MED ORDER — PREDNISONE 10 MG (21) PO TBPK
ORAL_TABLET | ORAL | 0 refills | Status: DC
Start: 2023-04-20 — End: 2023-07-05

## 2023-04-20 NOTE — Progress Notes (Signed)
 Virtual Visit Consent   Diana [name], you are scheduled for a virtual visit with a [facility] provider today. Just as with appointments in the office, your consent must be obtained to participate. Your consent will be active for this visit and any virtual visit you may have with one of our providers in the next 365 days. If you have a MyChart account, a copy of this consent can be sent to you electronically.  As this is a virtual visit, video technology does not allow for your provider to perform a traditional examination. This may limit your provider's ability to fully assess your condition. If your provider identifies any concerns that need to be evaluated in person or the need to arrange testing (such as labs, EKG, etc.), we will make arrangements to do so. Although advances in technology are sophisticated, we cannot ensure that it will always work on either your end or our end. If the connection with a video visit is poor, the visit may have to be switched to a telephone visit. With either a video or telephone visit, we are not always able to ensure that we have a secure connection.  By engaging in this virtual visit, you consent to the provision of healthcare and authorize for your insurance to be billed (if applicable) for the services provided during this visit. Depending on your insurance coverage, you may receive a charge related to this service.  I need to obtain your verbal consent now. Are you willing to proceed with your visit today? Diana [name] has provided verbal consent on 04/20/2023 for a virtual visit (video or telephone). Viviano Simas, FNP  Date: 04/20/2023 4:09 PM   Virtual Visit via Video Note   I, Viviano Simas, connected with  Diana [name]  ([id number], December 14, 1985) on 04/20/23 at  4:00 PM EDT by a video-enabled telemedicine application and verified that I am speaking with the correct person using two identifiers.  Location: Patient: Virtual Visit Location Patient:  Home Provider: Virtual Visit Location Provider: Home Office   I discussed the limitations of evaluation and management by telemedicine and the availability of in person appointments. The patient expressed understanding and agreed to proceed.    History of Present Illness: Diana [name] is a 38 y.o. who identifies as a female who was assigned female at birth, and is being seen today for an ongoing cough that has been keeping her up at night   She was seen 03/18/23 and was treated for viral URI and given tessalon pearls at that time   She has been using her Albuterol 1-2 times day   Her cough is mostly dry  She has not had fevers in the past week .   Cough is dry even in the mornings   Per med review she did take doxycycline in February   Problems:  Patient Active Problem List   Diagnosis Date Noted   Annual physical exam 11/09/2022   Insomnia 03/05/2022   Anxiety and depression 02/04/2022   Iron deficiency anemia due to chronic blood loss 09/05/2021   Right thyroid nodule 01/10/2020   Asthma 11/04/2019   Allergic rhinitis 08/16/2018   Essential hypertension 07/17/2018    Allergies:  Allergies  Allergen Reactions   Dog Epithelium (Canis Lupus Familiaris) Itching   Gramineae Pollens Rash   Pseudoephedrine Palpitations    Raises BP also   Amoxicillin Itching and Rash   Medications:  Current Outpatient Medications:    albuterol (VENTOLIN HFA) 108 (90 Base) MCG/ACT inhaler, Inhale 2  puffs into the lungs every 6 (six) hours as needed for wheezing or shortness of breath., Disp: 8 g, Rfl: 0   amLODipine (NORVASC) 10 MG tablet, Take 1 tablet (10 mg total) by mouth daily., Disp: 90 tablet, Rfl: 1   benzonatate (TESSALON) 200 MG capsule, Take 1 capsule (200 mg total) by mouth 3 (three) times daily as needed for cough., Disp: 20 capsule, Rfl: 0   cetirizine (ZYRTEC) 10 MG tablet, TAKE 1 TABLET BY MOUTH EVERY DAY, Disp: 90 tablet, Rfl: 1   Cholecalciferol (VITAMIN D3) 50 MCG (2000 UT)  capsule, TAKE 1 CAPSULE BY MOUTH EVERY DAY, Disp: 90 capsule, Rfl: 1   doxycycline (VIBRA-TABS) 100 MG tablet, Take 1 tablet (100 mg total) by mouth 2 (two) times daily. Take with food., Disp: 14 tablet, Rfl: 0   fluticasone (FLONASE) 50 MCG/ACT nasal spray, Place 1 spray into both nostrils 2 (two) times daily., Disp: 16 g, Rfl: 2   hydrochlorothiazide (HYDRODIURIL) 25 MG tablet, Take 1 tablet (25 mg total) by mouth daily., Disp: 90 tablet, Rfl: 3   ipratropium (ATROVENT) 0.06 % nasal spray, Place 2 sprays into both nostrils 4 (four) times daily as needed for rhinitis., Disp: 15 mL, Rfl: 0   Iron, Ferrous Sulfate, 325 (65 Fe) MG TABS, Take 325 mg by mouth daily., Disp: 90 tablet, Rfl: 1   labetalol (NORMODYNE) 100 MG tablet, TAKE 1 TABLET BY MOUTH TWICE A DAY, Disp: 180 tablet, Rfl: 1   pantoprazole (PROTONIX) 40 MG tablet, Take 1 tablet (40 mg total) by mouth daily., Disp: 14 tablet, Rfl: 0   potassium chloride SA (KLOR-CON M20) 20 MEQ tablet, TAKE 1/2 TABLETS (10 MEQ TOTAL) BY MOUTH DAILY., Disp: 45 tablet, Rfl: 1   Prenatal Vit-Fe Fumarate-FA (PRENATAL VITAMINS) 28-0.8 MG TABS, 1 tablet daily, Disp: 30 tablet, Rfl:    Semaglutide-Weight Management 0.25 MG/0.5ML SOAJ, Inject 0.25 mg into the skin once a week., Disp: 2 mL, Rfl: 0   sertraline (ZOLOFT) 50 MG tablet, TAKE 1 TABLET BY MOUTH EVERY DAY (Patient taking differently: Take 50 mg by mouth as needed.), Disp: 90 tablet, Rfl: 1   tiZANidine (ZANAFLEX) 4 MG tablet, Take 1 tablet (4 mg total) by mouth every 8 (eight) hours as needed for muscle spasms. Do not take with alcohol or while driving or operating heavy machinery.  May cause drowsiness., Disp: 30 tablet, Rfl: 0   traZODone (DESYREL) 50 MG tablet, TAKE 0.5-1 TABLETS BY MOUTH AT BEDTIME AS NEEDED FOR SLEEP., Disp: 90 tablet, Rfl: 2  Observations/Objective: Patient is well-developed, well-nourished in no acute distress.  Resting comfortably  at home.  Head is normocephalic, atraumatic.  No  labored breathing.  Speech is clear and coherent with logical content.  Patient is alert and oriented at baseline.    Assessment and Plan:   1. Bronchitis (Primary)  History is consistent with inflammatory bronchitis without signs of secondary infection  Strict follow up precautions for new/worsening symptoms    - predniSONE (STERAPRED UNI-PAK 21 TAB) 10 MG (21) TBPK tablet; Take 6 tablets on day one, 5 on day two, 4 on day three, 3 on day four, 2 on day five, and 1 on day six. Take with food.  Dispense: 21 tablet; Refill: 0   - promethazine-dextromethorphan (PROMETHAZINE-DM) 6.25-15 MG/5ML syrup; Take 5 mLs by mouth 4 (four) times daily as needed for cough.  Dispense: 118 mL; Refill: 0     Follow Up Instructions: I discussed the assessment and treatment plan with the patient.  The patient was provided an opportunity to ask questions and all were answered. The patient agreed with the plan and demonstrated an understanding of the instructions.  A copy of instructions were sent to the patient via MyChart unless otherwise noted below.    The patient was advised to call back or seek an in-person evaluation if the symptoms worsen or if the condition fails to improve as anticipated.    Viviano Simas, FNP

## 2023-05-05 ENCOUNTER — Other Ambulatory Visit: Payer: Self-pay | Admitting: Family Medicine

## 2023-05-05 DIAGNOSIS — I10 Essential (primary) hypertension: Secondary | ICD-10-CM

## 2023-05-08 ENCOUNTER — Ambulatory Visit: Payer: 59 | Admitting: Family Medicine

## 2023-07-03 ENCOUNTER — Other Ambulatory Visit: Payer: Self-pay | Admitting: Family Medicine

## 2023-07-05 ENCOUNTER — Ambulatory Visit: Payer: Self-pay | Admitting: Physician Assistant

## 2023-07-05 ENCOUNTER — Ambulatory Visit (INDEPENDENT_AMBULATORY_CARE_PROVIDER_SITE_OTHER)

## 2023-07-05 ENCOUNTER — Ambulatory Visit
Admission: RE | Admit: 2023-07-05 | Discharge: 2023-07-05 | Disposition: A | Discharge status: Home / Self Care | Source: Ambulatory Visit | Attending: Physician Assistant | Admitting: Physician Assistant

## 2023-07-05 VITALS — BP 127/79 | HR 85 | Temp 99.0°F | Resp 20

## 2023-07-05 DIAGNOSIS — J40 Bronchitis, not specified as acute or chronic: Secondary | ICD-10-CM

## 2023-07-05 DIAGNOSIS — R051 Acute cough: Secondary | ICD-10-CM

## 2023-07-05 DIAGNOSIS — J4521 Mild intermittent asthma with (acute) exacerbation: Secondary | ICD-10-CM

## 2023-07-05 DIAGNOSIS — J329 Chronic sinusitis, unspecified: Secondary | ICD-10-CM | POA: Diagnosis not present

## 2023-07-05 MED ORDER — ALBUTEROL SULFATE HFA 108 (90 BASE) MCG/ACT IN AERS
2.0000 | INHALATION_SPRAY | Freq: Once | RESPIRATORY_TRACT | Status: AC
  Administered 2023-07-05: 2 via RESPIRATORY_TRACT

## 2023-07-05 MED ORDER — AEROCHAMBER PLUS FLO-VU LARGE MISC
1.0000 | Freq: Once | Status: AC
  Administered 2023-07-05: 1

## 2023-07-05 MED ORDER — PREDNISONE 10 MG (21) PO TBPK: ORAL_TABLET | ORAL | 0 refills | Status: DC

## 2023-07-05 MED ORDER — DOXYCYCLINE HYCLATE 100 MG PO CAPS: 100.0000 mg | ORAL_CAPSULE | Freq: Two times a day (BID) | ORAL | 0 refills | Status: DC

## 2023-07-05 NOTE — ED Triage Notes (Signed)
 Pt reports she is wheezing and has some chest congestion x 4 days  Pt states she ran out of her asthma inhaler needs refill

## 2023-07-05 NOTE — ED Provider Notes (Signed)
 [facility]    CSN: [id number] Arrival date & time: 07/05/23  1301      History   Chief Complaint Chief Complaint  Patient presents with   Wheezing    Entered by patient    HPI Diana [name] is a 38 y.o. female.   Patient presents today with a several week history of URI symptoms that have worsened in the past several days.  She reports initially having some sinus congestion with postnasal drainage.  This has persisted but now she is developed cough, wheezing, rattling in her chest.  She denies any shortness of breath, chest pain, nausea, vomiting.  She has been taking over-the-counter medications without improvement of symptoms.  She does have a history of asthma but does not have her albuterol  inhaler available.  She does not take any maintenance medicine and has never been hospitalized for asthma.  She denies any recent antibiotics or steroids.  Denies any known sick contacts.  She has no concern for pregnancy.  She is having difficulty with her daily activities as result of her symptoms.    Past Medical History:  Diagnosis Date   Asthma    Hypertension    Urticaria     Patient Active Problem List   Diagnosis Date Noted   Annual physical exam 11/09/2022   Insomnia 03/05/2022   Anxiety and depression 02/04/2022   Iron  deficiency anemia due to chronic blood loss 09/05/2021   Right thyroid  nodule 01/10/2020   Asthma 11/04/2019   Allergic rhinitis 08/16/2018   Essential hypertension 07/17/2018    History reviewed. No pertinent surgical history.  OB History   No obstetric history on file.      Home Medications    Prior to Admission medications   Medication Sig Start Date End Date Taking? Authorizing Provider  doxycycline  (VIBRAMYCIN ) 100 MG capsule Take 1 capsule (100 mg total) by mouth 2 (two) times daily. 07/05/23  Yes Virjean Boman, Betsey Brow, PA-C  predniSONE  (STERAPRED UNI-PAK 21 TAB) 10 MG (21) TBPK tablet As directed 07/05/23  Yes Zina Pitzer K, PA-C   albuterol  (VENTOLIN  HFA) 108 (90 Base) MCG/ACT inhaler Inhale 2 puffs into the lungs every 6 (six) hours as needed for wheezing or shortness of breath. 03/30/23   Cook, Jayce G, DO  amLODipine  (NORVASC ) 10 MG tablet TAKE 1 TABLET BY MOUTH EVERY DAY 05/05/23   Cook, Jayce G, DO  cetirizine  (ZYRTEC ) 10 MG tablet TAKE 1 TABLET BY MOUTH EVERY DAY 05/12/22   Cook, Jayce G, DO  Cholecalciferol (VITAMIN D3) 50 MCG (2000 UT) capsule TAKE 1 CAPSULE BY MOUTH EVERY DAY 06/12/21   Ameduite, Leonna S, FNP  ipratropium (ATROVENT ) 0.06 % nasal spray Place 2 sprays into both nostrils 4 (four) times daily as needed for rhinitis. 04/15/22   Cook, Jayce G, DO  labetalol  (NORMODYNE ) 100 MG tablet TAKE 1 TABLET BY MOUTH TWICE A DAY 11/13/22   Cook, Jayce G, DO  potassium chloride  SA (KLOR-CON  M20) 20 MEQ tablet TAKE 1/2 TABLETS (10 MEQ TOTAL) BY MOUTH DAILY. 01/15/22   Cook, Jayce G, DO  Prenatal Vit-Fe Fumarate-FA (PRENATAL VITAMINS) 28-0.8 MG TABS 1 tablet daily 07/16/18   [place], Kawanta F, MD  tiZANidine  (ZANAFLEX ) 4 MG tablet Take 1 tablet (4 mg total) by mouth every 8 (eight) hours as needed for muscle spasms. Do not take with alcohol or while driving or operating heavy machinery.  May cause drowsiness. 11/05/22   Wilhemena Harbour, NP    Family History Family History  Problem Relation Age of Onset   Hypertension Mother    Hypertension Father    Asthma Sister    Allergic rhinitis Paternal Grandmother     Social History Social History   Tobacco Use   Smoking status: Never   Smokeless tobacco: Never  Vaping Use   Vaping status: Never Used  Substance Use Topics   Alcohol use: No   Drug use: No     Allergies   Dog epithelium (canis lupus familiaris), Gramineae pollens, Pseudoephedrine, and Amoxicillin   Review of Systems Review of Systems  Constitutional:  Positive for activity change. Negative for appetite change, fatigue and fever.  HENT:  Positive for congestion, postnasal drip and sinus  pressure. Negative for sneezing and sore throat.   Respiratory:  Positive for cough, chest tightness and wheezing. Negative for shortness of breath.   Cardiovascular:  Negative for chest pain.  Gastrointestinal:  Negative for abdominal pain, diarrhea, nausea and vomiting.     Physical Exam Triage Vital Signs ED Triage Vitals  Encounter Vitals Group     BP 07/05/23 1328 127/79     Systolic BP Percentile --      Diastolic BP Percentile --      Pulse Rate 07/05/23 1328 85     Resp 07/05/23 1328 20     Temp 07/05/23 1328 99 F (37.2 C)     Temp Source 07/05/23 1328 Oral     SpO2 07/05/23 1328 99 %     Weight --      Height --      Head Circumference --      Peak Flow --      Pain Score 07/05/23 1326 0     Pain Loc --      Pain Education --      Exclude from Growth Chart --    No data found.  Updated Vital Signs BP 127/79 (BP Location: Right Arm)   Pulse 85   Temp 99 F (37.2 C) (Oral)   Resp 20   LMP 06/18/2023   SpO2 99%   Visual Acuity Right Eye Distance:   Left Eye Distance:   Bilateral Distance:    Right Eye Near:   Left Eye Near:    Bilateral Near:     Physical Exam Vitals reviewed.  Constitutional:      General: She is awake. She is not in acute distress.    Appearance: Normal appearance. She is well-developed. She is not ill-appearing.     Comments: Very pleasant female appears stated age in no acute distress  HENT:     Head: Normocephalic and atraumatic.     Right Ear: Tympanic membrane, ear canal and external ear normal. Tympanic membrane is not erythematous or bulging.     Left Ear: Tympanic membrane, ear canal and external ear normal. Tympanic membrane is not erythematous or bulging.     Nose:     Right Sinus: Maxillary sinus tenderness present. No frontal sinus tenderness.     Left Sinus: Maxillary sinus tenderness present. No frontal sinus tenderness.     Mouth/Throat:     Pharynx: Uvula midline. Posterior oropharyngeal erythema and postnasal  drip present. No oropharyngeal exudate.  Cardiovascular:     Rate and Rhythm: Normal rate and regular rhythm.     Heart sounds: Normal heart sounds, S1 normal and S2 normal. No murmur heard. Pulmonary:     Effort: Pulmonary effort is normal.     Breath sounds: Examination of the right-lower field  reveals decreased breath sounds. Examination of the left-lower field reveals decreased breath sounds. Decreased breath sounds present. No wheezing, rhonchi or rales.  Psychiatric:        Behavior: Behavior is cooperative.      UC Treatments / Results  Labs (all labs ordered are listed, but only abnormal results are displayed) Labs Reviewed - No data to display  EKG   Radiology No results found.  Procedures Procedures (including critical care time)  Medications Ordered in UC Medications  albuterol  (VENTOLIN  HFA) 108 (90 Base) MCG/ACT inhaler 2 puff (2 puffs Inhalation Given 07/05/23 1343)  AeroChamber Plus Flo-Vu Large MISC 1 each (1 each Other Given 07/05/23 1343)    Initial Impression / Assessment and Plan / UC Course  I have reviewed the triage vital signs and the nursing notes.  Pertinent labs & imaging results that were available during my care of the patient were reviewed by me and considered in my medical decision making (see chart for details).     Patient is well-appearing, afebrile, nontoxic, nontachycardic.  No indication for viral testing as she has been symptomatic for several weeks and this would not change management.  Chest x-ray was obtained that showed no acute cardiopulmonary disease based on my primary read.  At the time of discharge we will waiting for radiologist overread and we will contact her if this differs and changes her treatment plan.  She was given albuterol  in clinic with improvement of symptoms.  I suspect she has sinobronchitis that is triggering her asthma.  She will start prednisone  taper and we discussed that she should not take NSAIDs with this  medication and risk of GI bleeding.  She was given albuterol  inhaler to take home and we discussed that she can use this every 4-6 hours as needed but if she is having to use it frequently she should return for reevaluation.  Will start doxycycline  to cover for sinobronchitis given her history of allergy to amoxicillin.  Discussed that she is to avoid prolonged exposure while on this medication.  She can use over-the-counter medication including Mucinex, Flonase , Tylenol  for additional symptom relief.  She is to rest and drink plenty of fluid.  Discussed that if anything worsens or changes she needs to be seen immediately.  Strict return precautions given.  Excuse note provided.  Final Clinical Impressions(s) / UC Diagnoses   Final diagnoses:  Acute cough  Sinobronchitis  Mild intermittent asthma with exacerbation     Discharge Instructions      We are treating your for a sinus/bronchitis infection. I think this is triggering your asthma. Start doxycycline  twice a day. Stay out of the sun while on this medication. Start prednisone  taper. Do not take NSAIDs including aspirin, ibuprofen , naproxen  with this medication. Use the inhaler every 4-6 hours as needed. If you are not feeling better within a few days or if anything worsens, please return for re-evaluation as we discussed.    ED Prescriptions     Medication Sig Dispense Auth. Provider   doxycycline  (VIBRAMYCIN ) 100 MG capsule Take 1 capsule (100 mg total) by mouth 2 (two) times daily. 20 capsule Afifa Truax K, PA-C   predniSONE  (STERAPRED UNI-PAK 21 TAB) 10 MG (21) TBPK tablet As directed 21 tablet Kimo Bancroft K, PA-C      PDMP not reviewed this encounter.   Budd Cargo, PA-C 07/05/23 1422

## 2023-07-05 NOTE — Discharge Instructions (Signed)
 We are treating your for a sinus/bronchitis infection. I think this is triggering your asthma. Start doxycycline  twice a day. Stay out of the sun while on this medication. Start prednisone  taper. Do not take NSAIDs including aspirin, ibuprofen , naproxen  with this medication. Use the inhaler every 4-6 hours as needed. If you are not feeling better within a few days or if anything worsens, please return for re-evaluation as we discussed.

## 2023-07-13 ENCOUNTER — Ambulatory Visit

## 2023-07-13 ENCOUNTER — Emergency Department (HOSPITAL_COMMUNITY)
Admission: EM | Admit: 2023-07-13 | Discharge: 2023-07-13 | Disposition: A | Discharge status: Home / Self Care | Attending: Emergency Medicine | Admitting: Emergency Medicine

## 2023-07-13 ENCOUNTER — Other Ambulatory Visit: Payer: Self-pay

## 2023-07-13 ENCOUNTER — Emergency Department (HOSPITAL_COMMUNITY): Payer: Self-pay

## 2023-07-13 ENCOUNTER — Encounter (HOSPITAL_COMMUNITY): Payer: Self-pay

## 2023-07-13 DIAGNOSIS — N939 Abnormal uterine and vaginal bleeding, unspecified: Secondary | ICD-10-CM | POA: Insufficient documentation

## 2023-07-13 DIAGNOSIS — R1030 Lower abdominal pain, unspecified: Secondary | ICD-10-CM | POA: Insufficient documentation

## 2023-07-13 DIAGNOSIS — Z79899 Other long term (current) drug therapy: Secondary | ICD-10-CM | POA: Insufficient documentation

## 2023-07-13 DIAGNOSIS — I10 Essential (primary) hypertension: Secondary | ICD-10-CM | POA: Insufficient documentation

## 2023-07-13 DIAGNOSIS — E876 Hypokalemia: Secondary | ICD-10-CM | POA: Insufficient documentation

## 2023-07-13 DIAGNOSIS — J45909 Unspecified asthma, uncomplicated: Secondary | ICD-10-CM | POA: Insufficient documentation

## 2023-07-13 HISTORY — DX: Iron deficiency anemia, unspecified: D50.9

## 2023-07-13 LAB — TYPE AND SCREEN
ABO/RH(D): O POS
Antibody Screen: NEGATIVE

## 2023-07-13 LAB — CBC WITH DIFFERENTIAL/PLATELET
Abs Immature Granulocytes: 0.06 10*3/uL (ref 0.00–0.07)
Basophils Absolute: 0 10*3/uL (ref 0.0–0.1)
Basophils Relative: 0 %
Eosinophils Absolute: 0.3 10*3/uL (ref 0.0–0.5)
Eosinophils Relative: 2 %
HCT: 27.3 % — ABNORMAL LOW (ref 36.0–46.0)
Hemoglobin: 8.5 g/dL — ABNORMAL LOW (ref 12.0–15.0)
Immature Granulocytes: 0 %
Lymphocytes Relative: 34 %
Lymphs Abs: 4.9 10*3/uL — ABNORMAL HIGH (ref 0.7–4.0)
MCH: 23.1 pg — ABNORMAL LOW (ref 26.0–34.0)
MCHC: 31.1 g/dL (ref 30.0–36.0)
MCV: 74.2 fL — ABNORMAL LOW (ref 80.0–100.0)
Monocytes Absolute: 0.7 10*3/uL (ref 0.1–1.0)
Monocytes Relative: 5 %
Neutro Abs: 8.6 10*3/uL — ABNORMAL HIGH (ref 1.7–7.7)
Neutrophils Relative %: 59 %
Platelets: 507 10*3/uL — ABNORMAL HIGH (ref 150–400)
RBC: 3.68 MIL/uL — ABNORMAL LOW (ref 3.87–5.11)
RDW: 20.2 % — ABNORMAL HIGH (ref 11.5–15.5)
WBC: 14.5 10*3/uL — ABNORMAL HIGH (ref 4.0–10.5)
nRBC: 0 % (ref 0.0–0.2)

## 2023-07-13 LAB — COMPREHENSIVE METABOLIC PANEL WITH GFR
ALT: 24 U/L (ref 0–44)
AST: 15 U/L (ref 15–41)
Albumin: 2.9 g/dL — ABNORMAL LOW (ref 3.5–5.0)
Alkaline Phosphatase: 57 U/L (ref 38–126)
Anion gap: 9 (ref 5–15)
BUN: 12 mg/dL (ref 6–20)
CO2: 27 mmol/L (ref 22–32)
Calcium: 8.3 mg/dL — ABNORMAL LOW (ref 8.9–10.3)
Chloride: 101 mmol/L (ref 98–111)
Creatinine, Ser: 0.67 mg/dL (ref 0.44–1.00)
GFR, Estimated: >60 mL/min (ref >60)
Glucose, Bld: 115 mg/dL — ABNORMAL HIGH (ref 70–99)
Potassium: 3 mmol/L — ABNORMAL LOW (ref 3.5–5.1)
Sodium: 137 mmol/L (ref 135–145)
Total Bilirubin: 0.2 mg/dL (ref 0.0–1.2)
Total Protein: 6.1 g/dL — ABNORMAL LOW (ref 6.5–8.1)

## 2023-07-13 LAB — POC OCCULT BLOOD, ED: Fecal Occult Blood: POSITIVE

## 2023-07-13 LAB — URINALYSIS, ROUTINE W REFLEX MICROSCOPIC
Bacteria, UA: NONE SEEN
Bilirubin Urine: NEGATIVE
Glucose, UA: NEGATIVE mg/dL
Ketones, ur: NEGATIVE mg/dL
Nitrite: NEGATIVE
Protein, ur: 30 mg/dL — AB
RBC / HPF: >50 RBC/hpf (ref 0–5)
Specific Gravity, Urine: 1.016 (ref 1.005–1.030)
pH: 8 (ref 5.0–8.0)

## 2023-07-13 LAB — HCG, QUANTITATIVE, PREGNANCY: hCG, Beta Chain, Quant, S: <1 m[IU]/mL (ref <5)

## 2023-07-13 LAB — PREGNANCY, URINE: Preg Test, Ur: NEGATIVE

## 2023-07-13 MED ORDER — ESTROGENS CONJUGATED 25 MG IJ SOLR
25.0000 mg | Freq: Once | INTRAMUSCULAR | Status: AC
  Administered 2023-07-13: 25 mg via INTRAVENOUS

## 2023-07-13 MED ORDER — MEGESTROL ACETATE 40 MG PO TABS: 40.0000 mg | ORAL_TABLET | Freq: Three times a day (TID) | ORAL | 0 refills | Status: DC

## 2023-07-13 MED ORDER — SODIUM CHLORIDE 0.9 % IV BOLUS
1000.0000 mL | Freq: Once | INTRAVENOUS | Status: AC
  Administered 2023-07-13: 1000 mL via INTRAVENOUS

## 2023-07-13 MED ORDER — ONDANSETRON HCL 4 MG/2ML IJ SOLN
4.0000 mg | Freq: Once | INTRAMUSCULAR | Status: AC
  Administered 2023-07-13: 4 mg via INTRAVENOUS
  Filled 2023-07-13: qty 2

## 2023-07-13 MED ORDER — MORPHINE SULFATE (PF) 4 MG/ML IV SOLN
3.0000 mg | Freq: Once | INTRAVENOUS | Status: AC
  Administered 2023-07-13: 3 mg via INTRAVENOUS
  Filled 2023-07-13: qty 1

## 2023-07-13 MED ORDER — POTASSIUM CHLORIDE CRYS ER 20 MEQ PO TBCR
40.0000 meq | EXTENDED_RELEASE_TABLET | Freq: Once | ORAL | Status: AC
  Administered 2023-07-13: 40 meq via ORAL
  Filled 2023-07-13: qty 2

## 2023-07-13 MED ORDER — MEGESTROL ACETATE 40 MG PO TABS
120.0000 mg | ORAL_TABLET | Freq: Every day | ORAL | Status: DC
  Administered 2023-07-13: 120 mg via ORAL
  Filled 2023-07-13: qty 3

## 2023-07-13 MED ORDER — STERILE WATER FOR INJECTION IJ SOLN
INTRAMUSCULAR | Status: AC
  Administered 2023-07-13: 10 mL
  Filled 2023-07-13: qty 10

## 2023-07-13 MED ORDER — IOHEXOL 300 MG/ML  SOLN
100.0000 mL | Freq: Once | INTRAMUSCULAR | Status: AC | PRN
  Administered 2023-07-13: 100 mL via INTRAVENOUS

## 2023-07-13 NOTE — ED Provider Triage Note (Signed)
 Emergency Medicine Provider Triage Evaluation Note  Diana [name] , a 38 y.o. female  was evaluated in triage.  Pt complains of generalized weakness, and concerned that he blood count is low.  Has hx of anemia.  Reports using a birth control patch. Began bleeding on Thursday of last week and has continued to have heavy vaginal bleeding and passing large blood clots since onset.  She was recently treated for cough and URI symptoms, completed doxycycline  and steroid with some improvement, was having shortness of breath with her illness, but denies any dyspnea or chest pain at this time.    Review of Systems  Positive: Generalized weakness, vaginal bleeding  Negative: Fever, vomiting, diarrhea  Physical Exam  BP (!) 139/91 (BP Location: Right Arm)   Pulse 87   Temp 98 F (36.7 C) (Oral)   Resp 16   Ht 5\' 2"  (1.575 m)   Wt 74.1 kg   LMP 07/13/2023 (Exact Date)   SpO2 100%   BMI 29.88 kg/m  Gen:   Awake, no distress   Resp:  Normal effort  MSK:   Moves extremities without difficulty  Other:    Medical Decision Making  Medically screening exam initiated at 3:12 PM.  Appropriate orders placed.  [name] was informed that the remainder of the evaluation will be completed by another provider, this initial triage assessment does not replace that evaluation, and the importance of remaining in the ED until their evaluation is complete.     Catherne Clubs, PA-C 07/13/23 1535

## 2023-07-13 NOTE — ED Provider Notes (Cosign Needed Addendum)
 [place] EMERGENCY DEPARTMENT AT [facility] Provider Note   CSN: [id number] Arrival date & time: 07/13/23  1301     History  Chief Complaint  Patient presents with   Vaginal Bleeding    Diana [name] is a 38 y.o. female.   Vaginal Bleeding Associated symptoms: abdominal pain and fatigue   Associated symptoms: no dizziness, no dysuria, no fever and no nausea        Diana [name] is a 38 y.o. female with past medical history of iron  deficient anemia, hypertension, and asthma who presents to the Emergency Department complaining of lower abdominal pain, generalized weakness and fatigue.  Symptoms present for 1 week.  She uses birth control patches, place patch late last week began having heavy vaginal bleeding since that time.  She endorses using several pads per day unable to quantify specific amount.  Also endorses passing large blood clots has been feeling weak since onset of her bleeding.  She has required iron  infusions in the past, with her last infusion 1 year ago.  She feels as though her iron  levels are low again.  She does take iron  supplements daily.  She was recently treated for URI symptoms as well.  She was treated with a course of doxycycline  and steroid taper which she has completed and she feels those symptoms have resolved. Denies any fever nausea or vomiting.  No dysuria symptoms, chest pain or shortness of breath.  Home Medications Prior to Admission medications   Medication Sig Start Date End Date Taking? Authorizing Provider  albuterol  (VENTOLIN  HFA) 108 (90 Base) MCG/ACT inhaler TAKE 2 PUFFS BY MOUTH EVERY 6 HOURS AS NEEDED FOR WHEEZE OR SHORTNESS OF BREATH 07/06/23   Cook, Jayce G, DO  amLODipine  (NORVASC ) 10 MG tablet TAKE 1 TABLET BY MOUTH EVERY DAY 05/05/23   Cook, Jayce G, DO  cetirizine  (ZYRTEC ) 10 MG tablet TAKE 1 TABLET BY MOUTH EVERY DAY 05/12/22   Cook, Jayce G, DO  Cholecalciferol (VITAMIN D3) 50 MCG (2000 UT) capsule TAKE 1 CAPSULE BY MOUTH EVERY  DAY 06/12/21   Ameduite, Leonna S, FNP  doxycycline  (VIBRAMYCIN ) 100 MG capsule Take 1 capsule (100 mg total) by mouth 2 (two) times daily. 07/05/23   Raspet, Erin K, PA-C  ipratropium (ATROVENT ) 0.06 % nasal spray Place 2 sprays into both nostrils 4 (four) times daily as needed for rhinitis. 04/15/22   Cook, Jayce G, DO  labetalol  (NORMODYNE ) 100 MG tablet TAKE 1 TABLET BY MOUTH TWICE A DAY 11/13/22   Cook, Jayce G, DO  potassium chloride  SA (KLOR-CON  M20) 20 MEQ tablet TAKE 1/2 TABLETS (10 MEQ TOTAL) BY MOUTH DAILY. 01/15/22   Cook, Jayce G, DO  predniSONE  (STERAPRED UNI-PAK 21 TAB) 10 MG (21) TBPK tablet As directed 07/05/23   Raspet, Betsey Brow, PA-C  Prenatal Vit-Fe Fumarate-FA (PRENATAL VITAMINS) 28-0.8 MG TABS 1 tablet daily 07/16/18   Largo, Kawanta F, MD  tiZANidine  (ZANAFLEX ) 4 MG tablet Take 1 tablet (4 mg total) by mouth every 8 (eight) hours as needed for muscle spasms. Do not take with alcohol or while driving or operating heavy machinery.  May cause drowsiness. 11/05/22   Wilhemena Harbour, NP      Allergies    Dog epithelium (canis lupus familiaris), Gramineae pollens, Pseudoephedrine, and Amoxicillin    Review of Systems   Review of Systems  Constitutional:  Positive for fatigue. Negative for appetite change, chills and fever.  HENT:  Negative for congestion.   Respiratory:  Negative for  cough and shortness of breath.   Cardiovascular:  Negative for chest pain.  Gastrointestinal:  Positive for abdominal pain. Negative for diarrhea, nausea and vomiting.  Genitourinary:  Positive for vaginal bleeding. Negative for dysuria, flank pain and urgency.  Musculoskeletal:  Negative for arthralgias and myalgias.  Neurological:  Positive for weakness. Negative for dizziness and numbness.  Psychiatric/Behavioral:  Negative for confusion.     Physical Exam Updated Vital Signs BP (!) 139/91 (BP Location: Right Arm)   Pulse 87   Temp 98 F (36.7 C) (Oral)   Resp 16   Ht 5\' 2"  (1.575 m)   Wt  74.1 kg   LMP 07/13/2023 (Exact Date)   SpO2 100%   BMI 29.88 kg/m  Physical Exam Vitals and nursing note reviewed. Exam conducted with a chaperone present.  Constitutional:      General: She is not in acute distress.    Appearance: Normal appearance. She is not ill-appearing or toxic-appearing.  Cardiovascular:     Rate and Rhythm: Normal rate and regular rhythm.     Pulses: Normal pulses.  Pulmonary:     Effort: Pulmonary effort is normal. No respiratory distress.     Breath sounds: Normal breath sounds.  Chest:     Chest wall: No tenderness.  Abdominal:     General: There is no distension.     Palpations: Abdomen is soft.     Tenderness: There is abdominal tenderness. There is no right CVA tenderness or left CVA tenderness.     Comments: Ttp of mid lower abdomen.  Abd is soft, no guarding or rebound tenderness.    Genitourinary:    Vagina: No tenderness.     Cervix: Cervical bleeding present. No cervical motion tenderness.     Adnexa:        Right: Tenderness present. No mass or fullness.         Left: No mass, tenderness or fullness.       Rectum: Guaiac result positive. No external hemorrhoid. Normal anal tone.     Comments: Heme positive, minimal stool.  Pt is menstruating.  No palpable rectal masses.    Pelvic exam performed by me, there is some tenderness of the right adnexa I do not appreciate any fullness or palpable mass.  No cervical motion tenderness.  There is blood and several small clots in the vaginal vault, visualization of the cervical os is limited due to the blood. Musculoskeletal:        General: Normal range of motion.  Skin:    General: Skin is warm.     Capillary Refill: Capillary refill takes less than 2 seconds.  Neurological:     General: No focal deficit present.     Mental Status: She is alert.     Sensory: No sensory deficit.     Motor: No weakness.     ED Results / Procedures / Treatments   Labs (all labs ordered are listed, but only  abnormal results are displayed) Labs Reviewed  CBC WITH DIFFERENTIAL/PLATELET - Abnormal; Notable for the following components:      Result Value   WBC 14.5 (*)    RBC 3.68 (*)    Hemoglobin 8.5 (*)    HCT 27.3 (*)    MCV 74.2 (*)    MCH 23.1 (*)    RDW 20.2 (*)    Platelets 507 (*)    Neutro Abs 8.6 (*)    Lymphs Abs 4.9 (*)    All other components  within normal limits  COMPREHENSIVE METABOLIC PANEL WITH GFR - Abnormal; Notable for the following components:   Potassium 3.0 (*)    Glucose, Bld 115 (*)    Calcium 8.3 (*)    Total Protein 6.1 (*)    Albumin 2.9 (*)    All other components within normal limits  PREGNANCY, URINE  HCG, QUANTITATIVE, PREGNANCY  URINALYSIS, ROUTINE W REFLEX MICROSCOPIC  POC OCCULT BLOOD, ED  TYPE AND SCREEN    EKG None  Radiology CT ABDOMEN PELVIS W CONTRAST Result Date: 07/13/2023 CLINICAL DATA:  Abdominal pain, low hemoglobin, vaginal bleeding. EXAM: CT ABDOMEN AND PELVIS WITH CONTRAST TECHNIQUE: Multidetector CT imaging of the abdomen and pelvis was performed using the standard protocol following bolus administration of intravenous contrast. RADIATION DOSE REDUCTION: This exam was performed according to the departmental dose-optimization program which includes automated exposure control, adjustment of the mA and/or kV according to patient size and/or use of iterative reconstruction technique. CONTRAST:  100mL OMNIPAQUE IOHEXOL 300 MG/ML  SOLN COMPARISON:  None Available. FINDINGS: Lower chest: No acute findings Hepatobiliary: No focal hepatic abnormality. Gallbladder unremarkable. Pancreas: No focal abnormality or ductal dilatation. Spleen: No focal abnormality.  Normal size. Adrenals/Urinary Tract: No adrenal abnormality. No focal renal abnormality. No stones or hydronephrosis. Urinary bladder is unremarkable. Stomach/Bowel: Normal appendix. Stomach, large and small bowel grossly unremarkable. Vascular/Lymphatic: No evidence of aneurysm or adenopathy.  Reproductive: Markedly thickened endometrium measuring up to 5 cm in thickness. This could reflect blood products within the endometrial cavity or true endometrial thickening. Fibroid in the right uterus measures 4.5 cm. Fundal fibroid measures up to 3.3 cm. No right adnexal mass. Mixed density mass in the left adnexa containing fat measures 4.9 x 3.5 cm most compatible with dermoid. Other: Small amount of free fluid in the pelvis.  No free air. Musculoskeletal: No acute bony abnormality. IMPRESSION: Markedly thickened endometrium measuring up to 5 cm in thickness versus endometrial distension with blood. Recommend further evaluation with pelvic ultrasound. 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 Uterine fibroids. Electronically Signed   By: Janeece Mechanic M.D.   On: 07/13/2023 18:40    Procedures Procedures    Medications Ordered in ED Medications - No data to display  ED Course/ Medical Decision Making/ A&P                                 Medical Decision Making Patient here with lower abdominal pain, heavy vaginal bleeding and generalized weakness.  Endorse history of iron  deficient anemia, takes iron  supplementation daily.  Began having heavy bleeding days ago.  Denies any nausea vomiting, dysuria, fever or chills.  No chest pain or shortness of breath.  Endorses some feelings of dizziness only upon standing and this has been intermittent.  Denies syncope  Her complaint of generalized weakness likely secondary to her excessive bleeding.  Differential would include but not limited to abnormal vaginal bleeding secondary to fibroids, miscarriage, threatened AB, lower abdominal pain possibly related to ectopic, TOA, ovarian cyst plus minus rupture.   Amount and/or Complexity of Data Reviewed Labs: ordered.    Details: Labs interpreted by me, mild leukocytosis with white count of 14,000, hemoglobin today is 8.5.  This appears to be somewhat lower than her  baseline of 10.  Chemistries show mild hypokalemia with potassium of 3.  Kidney functions unremarkable.  Quant less than 1  Hemoccult was obtained  but patient currently menstruating, minimal stool on exam that was heme positive, but high probability of contamination from vaginal blood Radiology: ordered.    Details: CT abd/pelvis for continued endometrium measuring up to 5 cm.  Endometrial thickness versus distention with blood.  5 x 3-1/2 fat-containing mass in the left adnexa likely a dermoid cyst.  Uterine fibroids are present. Discussion of management or test interpretation with external provider(s): Consulted with OB/GYN, Dr. Randolm Butte who recommends patient receive 25 mg IV Premarin, 120 mg Megace while here and then prescription for 40 mg Megace 3 times daily.  Will see close outpatient follow-up in clinic in 1 to 2 weeks.  Patient has received Premarin Megace, IV fluids here.  She is ambulate to the room several times without difficulty.  Reports feeling better..  Discussed CT findings with the patient and importance of close outpatient follow-up with OB/GYN.  Her mild hypokalemia was treated here with oral potassium, she will follow-up with PCP to have her potassium level rechecked in 1 to 2 weeks.  Risk Prescription drug management.      inal Clinical Impression(s) / ED Diagnoses Final diagnoses:  Abnormal uterine bleeding (AUB)  Hypokalemia    Rx / DC Orders ED Discharge Orders     None         Catherne Clubs, PA-C 07/13/23 2055    Catherne Clubs, PA-C 07/13/23 2100    Early Glisson, MD 07/14/23 1125

## 2023-07-13 NOTE — Discharge Instructions (Signed)
 You have been given prescription for medication to help with your vaginal bleeding.  Please take as directed.  Your potassium this evening was low.  You have been given potassium supplement during your ER stay.  Please eat foods rich in potassium such as bananas,raisins or apricots.  You will need to have your potassium rechecked in 1 week your primary care provider can arrange this for you.  Also please call the OB/GYN listed to arrange follow-up appointment in 1 to 2 weeks.  Return to the emergency department for any new or worsening symptoms

## 2023-07-13 NOTE — ED Triage Notes (Signed)
 Pt arrived via POV c/o having low hemoglobin and reports a recent antibiotic triggered her menstrual cycle early and reports heavy vaginal bleeding since. Pt now endorses weakness.

## 2023-07-17 ENCOUNTER — Ambulatory Visit: Payer: Self-pay

## 2023-07-17 NOTE — Telephone Encounter (Signed)
 Appt scheduled

## 2023-07-17 NOTE — Telephone Encounter (Signed)
 Patient calling to reschedule appointment.  Triage Disposition: Call PCP When Office is Open  Patient/caregiver understands and will follow disposition?: Yes        Copied from CRM [id number]. Topic: Clinical - Red Word Triage >> Jul 17, 2023  8:54 AM Diana [name] wrote: Red Word that prompted transfer to Nurse Triage: patient is stating went to ER feeling extremly weak, her muscles are sore and potassium is low. Reason for Disposition  Requesting regular office appointment  Answer Assessment - Initial Assessment Questions 1. REASON FOR CALL or QUESTION: What is your reason for calling today? or How can I best help you? or What question do you have that I can help answer?     Patient was transferred to triage, patient only calling to inquire as to why her appointment on 6/27 was cancelled. CAL was called and stated Dr. Debrah Fan will be out of office that day. Pt. Successfully rescheduled for 6/30. She has no further questions/concerns at this time.  Protocols used: Information Only Call - No Triage-A-AH

## 2023-07-27 ENCOUNTER — Encounter: Payer: Self-pay | Admitting: Hematology

## 2023-07-31 ENCOUNTER — Ambulatory Visit: Admitting: Family Medicine

## 2023-08-03 ENCOUNTER — Ambulatory Visit: Admitting: Family Medicine

## 2023-08-03 ENCOUNTER — Encounter: Payer: Self-pay | Admitting: Family Medicine

## 2023-08-03 VITALS — BP 118/72 | HR 85 | Temp 97.2°F | Ht 62.0 in | Wt 154.0 lb

## 2023-08-03 DIAGNOSIS — E876 Hypokalemia: Secondary | ICD-10-CM | POA: Insufficient documentation

## 2023-08-03 DIAGNOSIS — I10 Essential (primary) hypertension: Secondary | ICD-10-CM

## 2023-08-03 MED ORDER — AMLODIPINE BESYLATE 10 MG PO TABS: 10.0000 mg | ORAL_TABLET | Freq: Every day | ORAL | 3 refills | Status: AC

## 2023-08-03 NOTE — Patient Instructions (Signed)
 Lab today.  We will call with results.  Continue your current medications.  Follow up in 3 months.

## 2023-08-03 NOTE — Assessment & Plan Note (Signed)
 BMP today to reassess.

## 2023-08-03 NOTE — Assessment & Plan Note (Signed)
BP stable. Amlodipine refilled.

## 2023-08-03 NOTE — Progress Notes (Signed)
 Subjective:  Patient ID: Diana [name], female    DOB: 08-Jan-1986  Age: 38 y.o. MRN: [id number]  CC:   Chief Complaint  Patient presents with   Hypertension    HPI:  38 year old female presents for follow-up.  Patient recently in the hospital for abnormal uterine bleeding.  Significant anemia with a hemoglobin of 8.5.  Microcytic.  Patient was treated and discharged home on Megace .  Bleeding has improved but she is still bleeding slightly.  Patient also was found to be hypokalemic.  Etiology unclear.  Needs recheck of potassium today.  Patient states that she is feeling better but still feels weak particular with exertion.  She has plans to see OB/GYN early in July and states that she will be getting an IUD.  Patient Active Problem List   Diagnosis Date Noted   Hypokalemia 08/03/2023   Annual physical exam 11/09/2022   Insomnia 03/05/2022   Anxiety and depression 02/04/2022   Iron  deficiency anemia due to chronic blood loss 09/05/2021   Right thyroid  nodule 01/10/2020   Asthma 11/04/2019   Allergic rhinitis 08/16/2018   Essential hypertension 07/17/2018    Social Hx   Social History   Socioeconomic History   Marital status: Single    Spouse name: Not on file   Number of children: Not on file   Years of education: Not on file   Highest education level: Not on file  Occupational History   Not on file  Tobacco Use   Smoking status: Never   Smokeless tobacco: Never  Vaping Use   Vaping status: Never Used  Substance and Sexual Activity   Alcohol use: No   Drug use: No   Sexual activity: Not on file  Other Topics Concern   Not on file  Social History Narrative   Not on file   Social Drivers of Health   Financial Resource Strain: Not on file  Food Insecurity: Not on file  Transportation Needs: Not on file  Physical Activity: Not on file  Stress: Not on file  Social Connections: Not on file    Review of Systems Per HPI  Objective:  BP 118/72   Pulse 85    Temp (!) 97.2 F (36.2 C)   Ht 5' 2 (1.575 m)   Wt 154 lb (69.9 kg)   LMP 07/13/2023 (Exact Date)   SpO2 100%   BMI 28.17 kg/m      08/03/2023    8:59 AM 07/13/2023    9:05 PM 07/13/2023    4:00 PM  BP/Weight  Systolic BP 118 136 128  Diastolic BP 72 85 84  Wt. (Lbs) 154    BMI 28.17 kg/m2      Physical Exam Vitals and nursing note reviewed.  Constitutional:      General: She is not in acute distress.    Appearance: Normal appearance.  HENT:     Head: Normocephalic and atraumatic.   Cardiovascular:     Rate and Rhythm: Normal rate and regular rhythm.  Pulmonary:     Effort: Pulmonary effort is normal.     Breath sounds: Normal breath sounds.   Neurological:     Mental Status: She is alert.     Lab Results  Component Value Date   WBC 14.5 (H) 07/13/2023   HGB 8.5 (L) 07/13/2023   HCT 27.3 (L) 07/13/2023   PLT 507 (H) 07/13/2023   GLUCOSE 115 (H) 07/13/2023   CHOL 185 04/11/2021   TRIG 96 04/11/2021   HDL  55 04/11/2021   LDLCALC 113 (H) 04/11/2021   ALT 24 07/13/2023   AST 15 07/13/2023   NA 137 07/13/2023   K 3.0 (L) 07/13/2023   CL 101 07/13/2023   CREATININE 0.67 07/13/2023   BUN 12 07/13/2023   CO2 27 07/13/2023   TSH 0.815 04/11/2021   HGBA1C 5.3 06/22/2020     Assessment & Plan:  Hypokalemia Assessment & Plan: BMP today to reassess.  Orders: -     Basic metabolic panel with GFR  Essential hypertension Assessment & Plan: BP stable.  Amlodipine  refilled.  Orders: -     amLODIPine  Besylate; Take 1 tablet (10 mg total) by mouth daily.  Dispense: 90 tablet; Refill: 3    Follow-up:  Return in about 3 months (around 11/03/2023).  Jacqulyn Ahle DO [facility]

## 2023-08-09 ENCOUNTER — Encounter: Payer: Self-pay | Admitting: Hematology

## 2023-08-20 ENCOUNTER — Encounter: Payer: Self-pay | Admitting: Family Medicine

## 2023-09-07 ENCOUNTER — Other Ambulatory Visit: Payer: Self-pay | Admitting: Family Medicine

## 2023-09-07 ENCOUNTER — Encounter: Payer: Self-pay | Admitting: Family Medicine

## 2023-09-07 DIAGNOSIS — D500 Iron deficiency anemia secondary to blood loss (chronic): Secondary | ICD-10-CM

## 2023-09-08 ENCOUNTER — Encounter: Payer: Self-pay | Admitting: Family Medicine

## 2023-09-08 ENCOUNTER — Telehealth: Payer: Self-pay | Admitting: Family Medicine

## 2023-09-08 ENCOUNTER — Ambulatory Visit (INDEPENDENT_AMBULATORY_CARE_PROVIDER_SITE_OTHER): Payer: Self-pay | Admitting: Family Medicine

## 2023-09-08 VITALS — BP 132/74 | HR 61 | Temp 98.0°F | Ht 62.0 in | Wt 154.0 lb

## 2023-09-08 DIAGNOSIS — H9311 Tinnitus, right ear: Secondary | ICD-10-CM | POA: Diagnosis not present

## 2023-09-08 DIAGNOSIS — E876 Hypokalemia: Secondary | ICD-10-CM

## 2023-09-08 NOTE — Patient Instructions (Addendum)
 Check potassium whenever you like (It was not drawn despite the order being in).  Referring to ENT.  Potassium rich foods.

## 2023-09-08 NOTE — Progress Notes (Signed)
 Subjective:  Patient ID: Diana [name], female    DOB: December 12, 1985  Age: 37 y.o. MRN: [id number]  CC:   Chief Complaint  Patient presents with   Ear Problem    Swishing in ear x's 1 month worse when laying down. Not painful    HPI:  38 year old female presents for evaluation of the above.  Ongoing swishing in the right ear for the past month. No relief with Flonase  or antihistamine. This is quite troublesome for her. No reports of hearing loss.   Patient reached out yesterday regarding persistent bleeding following IUD insertion. Has baseline anemia. CBC and Iron  studies were done this morning. Labs not back yet.  Patient states that she is still concerned about hypokalemia that she had in the ED on 6/9. States that she feels tired/weak. Would like recheck.   Patient Active Problem List   Diagnosis Date Noted   Tinnitus, right ear 09/08/2023   Hypokalemia 08/03/2023   Insomnia 03/05/2022   Anxiety and depression 02/04/2022   Iron  deficiency anemia due to chronic blood loss 09/05/2021   Right thyroid  nodule 01/10/2020   Asthma 11/04/2019   Allergic rhinitis 08/16/2018   Essential hypertension 07/17/2018    Social Hx   Social History   Socioeconomic History   Marital status: Single    Spouse name: Not on file   Number of children: Not on file   Years of education: Not on file   Highest education level: Not on file  Occupational History   Not on file  Tobacco Use   Smoking status: Never   Smokeless tobacco: Never  Vaping Use   Vaping status: Never Used  Substance and Sexual Activity   Alcohol use: No   Drug use: No   Sexual activity: Not on file  Other Topics Concern   Not on file  Social History Narrative   Not on file   Social Drivers of Health   Financial Resource Strain: Not on file  Food Insecurity: Not on file  Transportation Needs: Not on file  Physical Activity: Not on file  Stress: Not on file  Social Connections: Not on file    Review of  Systems Per HPI  Objective:  BP 132/74   Pulse 61   Temp 98 F (36.7 C)   Ht 5' 2 (1.575 m)   Wt 154 lb (69.9 kg)   SpO2 94%   BMI 28.17 kg/m      09/08/2023    4:34 PM 09/08/2023    4:10 PM 08/03/2023    8:59 AM  BP/Weight  Systolic BP 132 140 118  Diastolic BP 74 91 72  Wt. (Lbs)  154 154  BMI  28.17 kg/m2 28.17 kg/m2    Physical Exam Constitutional:      General: She is not in acute distress.    Appearance: Normal appearance.  HENT:     Head: Normocephalic and atraumatic.     Right Ear: Tympanic membrane and ear canal normal.     Left Ear: Tympanic membrane and ear canal normal.  Neurological:     Mental Status: She is alert.     Lab Results  Component Value Date   WBC 14.5 (H) 07/13/2023   HGB 8.5 (L) 07/13/2023   HCT 27.3 (L) 07/13/2023   PLT 507 (H) 07/13/2023   GLUCOSE 115 (H) 07/13/2023   CHOL 185 04/11/2021   TRIG 96 04/11/2021   HDL 55 04/11/2021   LDLCALC 113 (H) 04/11/2021   ALT  24 07/13/2023   AST 15 07/13/2023   NA 137 07/13/2023   K 3.0 (L) 07/13/2023   CL 101 07/13/2023   CREATININE 0.67 07/13/2023   BUN 12 07/13/2023   CO2 27 07/13/2023   TSH 0.815 04/11/2021   HGBA1C 5.3 06/22/2020     Assessment & Plan:  Tinnitus, right ear Assessment & Plan: Referring to ENT.  Orders: -     Ambulatory referral to ENT  Hypokalemia Assessment & Plan: Metabolic panel ordered.  Orders: -     Comprehensive metabolic panel with GFR    Follow-up:  Return if symptoms worsen or fail to improve.  Jacqulyn Ahle DO [facility]

## 2023-09-08 NOTE — Assessment & Plan Note (Signed)
Metabolic panel ordered.

## 2023-09-08 NOTE — Telephone Encounter (Signed)
 Received call from [facility] at approximately 1123 PM. Critical Hemoglobin of 5.8. Called patient x 2. Left voicemail. Needs to go immediately to the hospital given severe anemia.  Jacqulyn Ahle DO [facility]

## 2023-09-08 NOTE — Assessment & Plan Note (Signed)
 Referring to ENT.

## 2023-09-09 ENCOUNTER — Other Ambulatory Visit: Payer: Self-pay

## 2023-09-09 ENCOUNTER — Encounter: Payer: Self-pay | Admitting: Hematology

## 2023-09-09 ENCOUNTER — Encounter (HOSPITAL_COMMUNITY): Payer: Self-pay | Admitting: Emergency Medicine

## 2023-09-09 ENCOUNTER — Emergency Department (HOSPITAL_COMMUNITY)
Admission: EM | Admit: 2023-09-09 | Discharge: 2023-09-09 | Disposition: A | Discharge status: Home / Self Care | Source: Ambulatory Visit | Attending: Emergency Medicine | Admitting: Emergency Medicine

## 2023-09-09 DIAGNOSIS — J45909 Unspecified asthma, uncomplicated: Secondary | ICD-10-CM | POA: Insufficient documentation

## 2023-09-09 DIAGNOSIS — E876 Hypokalemia: Secondary | ICD-10-CM | POA: Insufficient documentation

## 2023-09-09 DIAGNOSIS — D649 Anemia, unspecified: Secondary | ICD-10-CM | POA: Diagnosis not present

## 2023-09-09 DIAGNOSIS — Z79899 Other long term (current) drug therapy: Secondary | ICD-10-CM | POA: Diagnosis not present

## 2023-09-09 DIAGNOSIS — N939 Abnormal uterine and vaginal bleeding, unspecified: Secondary | ICD-10-CM | POA: Diagnosis present

## 2023-09-09 DIAGNOSIS — D75839 Thrombocytosis, unspecified: Secondary | ICD-10-CM | POA: Diagnosis not present

## 2023-09-09 DIAGNOSIS — R7309 Other abnormal glucose: Secondary | ICD-10-CM | POA: Insufficient documentation

## 2023-09-09 DIAGNOSIS — I10 Essential (primary) hypertension: Secondary | ICD-10-CM | POA: Insufficient documentation

## 2023-09-09 DIAGNOSIS — R739 Hyperglycemia, unspecified: Secondary | ICD-10-CM

## 2023-09-09 LAB — URINALYSIS, ROUTINE W REFLEX MICROSCOPIC
Bacteria, UA: NONE SEEN
Bilirubin Urine: NEGATIVE
Glucose, UA: NEGATIVE mg/dL
Ketones, ur: NEGATIVE mg/dL
Leukocytes,Ua: NEGATIVE
Nitrite: NEGATIVE
Protein, ur: 30 mg/dL — AB
RBC / HPF: >50 RBC/hpf (ref 0–5)
Specific Gravity, Urine: 1.009 (ref 1.005–1.030)
pH: 7 (ref 5.0–8.0)

## 2023-09-09 LAB — CBC WITH DIFFERENTIAL/PLATELET
Abs Immature Granulocytes: 0.04 K/uL (ref 0.00–0.07)
Basophils Absolute: 0.1 K/uL (ref 0.0–0.1)
Basophils Relative: 1 %
Eosinophils Absolute: 0.1 K/uL (ref 0.0–0.5)
Eosinophils Relative: 1 %
HCT: 20.7 % — ABNORMAL LOW (ref 36.0–46.0)
Hemoglobin: 6.1 g/dL — CL (ref 12.0–15.0)
Immature Granulocytes: 0 %
Lymphocytes Relative: 23 %
Lymphs Abs: 2.4 K/uL (ref 0.7–4.0)
MCH: 20 pg — ABNORMAL LOW (ref 26.0–34.0)
MCHC: 29.5 g/dL — ABNORMAL LOW (ref 30.0–36.0)
MCV: 67.9 fL — ABNORMAL LOW (ref 80.0–100.0)
Monocytes Absolute: 0.6 K/uL (ref 0.1–1.0)
Monocytes Relative: 6 %
Neutro Abs: 7.4 K/uL (ref 1.7–7.7)
Neutrophils Relative %: 69 %
Platelets: 529 K/uL — ABNORMAL HIGH (ref 150–400)
RBC: 3.05 MIL/uL — ABNORMAL LOW (ref 3.87–5.11)
RDW: 18.2 % — ABNORMAL HIGH (ref 11.5–15.5)
WBC: 10.7 K/uL — ABNORMAL HIGH (ref 4.0–10.5)
nRBC: 0 % (ref 0.0–0.2)

## 2023-09-09 LAB — BASIC METABOLIC PANEL WITH GFR
Anion gap: 12 (ref 5–15)
BUN: 11 mg/dL (ref 6–20)
CO2: 21 mmol/L — ABNORMAL LOW (ref 22–32)
Calcium: 8.7 mg/dL — ABNORMAL LOW (ref 8.9–10.3)
Chloride: 106 mmol/L (ref 98–111)
Creatinine, Ser: 0.5 mg/dL (ref 0.44–1.00)
GFR, Estimated: >60 mL/min (ref >60)
Glucose, Bld: 105 mg/dL — ABNORMAL HIGH (ref 70–99)
Potassium: 3.1 mmol/L — ABNORMAL LOW (ref 3.5–5.1)
Sodium: 139 mmol/L (ref 135–145)

## 2023-09-09 LAB — PREPARE RBC (CROSSMATCH)

## 2023-09-09 LAB — PREGNANCY, URINE: Preg Test, Ur: NEGATIVE

## 2023-09-09 MED ORDER — POTASSIUM CHLORIDE CRYS ER 20 MEQ PO TBCR: 20.0000 meq | EXTENDED_RELEASE_TABLET | Freq: Three times a day (TID) | ORAL | 0 refills | Status: AC

## 2023-09-09 MED ORDER — POTASSIUM CHLORIDE CRYS ER 20 MEQ PO TBCR
40.0000 meq | EXTENDED_RELEASE_TABLET | Freq: Once | ORAL | Status: AC
  Administered 2023-09-09: 40 meq via ORAL
  Filled 2023-09-09: qty 2

## 2023-09-09 MED ORDER — SODIUM CHLORIDE 0.9% IV SOLUTION: Freq: Once | INTRAVENOUS | Status: AC

## 2023-09-09 MED ORDER — MEGESTROL ACETATE 40 MG PO TABS
40.0000 mg | ORAL_TABLET | Freq: Once | ORAL | Status: AC
  Administered 2023-09-09: 40 mg via ORAL
  Filled 2023-09-09: qty 1

## 2023-09-09 MED ORDER — FERROUS SULFATE 325 (65 FE) MG PO TABS: 325.0000 mg | ORAL_TABLET | Freq: Every day | ORAL | 0 refills | Status: AC

## 2023-09-09 MED ORDER — MEGESTROL ACETATE 40 MG PO TABS: 40.0000 mg | ORAL_TABLET | Freq: Three times a day (TID) | ORAL | 0 refills | Status: AC

## 2023-09-09 NOTE — ED Notes (Signed)
 ED Provider at bedside.

## 2023-09-09 NOTE — ED Notes (Signed)
 Pt given wet and dry washcloth and ambulatory to bathroom and back to room- pt father back at bedside.

## 2023-09-09 NOTE — ED Triage Notes (Signed)
 Pt c/o vaginal bleeding x one month since starting new birth control. Pcp sent pt here due to hemoglobin 5.8.

## 2023-09-09 NOTE — ED Notes (Signed)
 Date and time results received: 09/09/23 0337 (use smartphrase .now to insert current time)  Test: hgb Critical Value: 6.1  Name of Provider Notified: CHARM Lias , MD  Orders Received? Or Actions Taken?: n/a

## 2023-09-09 NOTE — ED Provider Notes (Signed)
 [place] EMERGENCY DEPARTMENT AT [facility] Provider Note   CSN: [id number] Arrival date & time: 09/09/23  9967     Patient presents with: Vaginal Bleeding   Diana [name] is a 38 y.o. female.   The history is provided by the patient.  Vaginal Bleeding  She has history of hypertension, asthma, iron  deficiency anemia and was told by her primary care provider to come in because her hemoglobin was 5.8.  She has been having problems with ongoing vaginal bleeding.  She was using a contraceptive patch, but was having significant vaginal bleeding with passing clots.  1 month ago, she was switched to the Mirena ring.  Since then, she continues to have bleeding about 4 pads a day but she is no longer passing clots.  Over the last week, she is not noticed General Weakness and fatigue and exertional dyspnea on walking short distances.  She saw her primary care provider today who drew blood and found her hemoglobin was 5.8.  Of note, she states that she is taking iron  supplements.    Prior to Admission medications   Medication Sig Start Date End Date Taking? Authorizing Provider  albuterol  (VENTOLIN  HFA) 108 (90 Base) MCG/ACT inhaler TAKE 2 PUFFS BY MOUTH EVERY 6 HOURS AS NEEDED FOR WHEEZE OR SHORTNESS OF BREATH 07/06/23   Cook, Jayce G, DO  amLODipine  (NORVASC ) 10 MG tablet Take 1 tablet (10 mg total) by mouth daily. 08/03/23   Cook, Jayce G, DO  cetirizine  (ZYRTEC ) 10 MG tablet TAKE 1 TABLET BY MOUTH EVERY DAY 05/12/22   Cook, Jayce G, DO  Cholecalciferol (VITAMIN D3) 50 MCG (2000 UT) capsule TAKE 1 CAPSULE BY MOUTH EVERY DAY 06/12/21   Ameduite, Leonna S, FNP  labetalol  (NORMODYNE ) 100 MG tablet TAKE 1 TABLET BY MOUTH TWICE A DAY 11/13/22   Cook, Jayce G, DO  megestrol  (MEGACE ) 40 MG tablet Take 1 tablet (40 mg total) by mouth 3 (three) times daily. 07/13/23   Herlinda Milling, PA-C  Prenatal Vit-Fe Fumarate-FA (PRENATAL VITAMINS) 28-0.8 MG TABS 1 tablet daily 07/16/18   Bari Theodoro FALCON, MD     Allergies: Amoxicillin, Dog epithelium (canis lupus familiaris), Gramineae pollens, and Pseudoephedrine    Review of Systems  Genitourinary:  Positive for vaginal bleeding.  All other systems reviewed and are negative.   Updated Vital Signs BP 134/83   Pulse (!) 103   Temp 98.6 F (37 C) (Oral)   Resp 17   Ht 5' 2 (1.575 m)   Wt 69.9 kg   SpO2 98%   BMI 28.19 kg/m   Physical Exam Vitals and nursing note reviewed.   38 year old female, resting comfortably and in no acute distress. Vital signs are significant for mildly elevated respiratory rate. Oxygen saturation is 99%, which is normal. Head is normocephalic and atraumatic. PERRLA, EOMI. . Lungs are clear without rales, wheezes, or rhonchi. Heart has regular rate and rhythm without murmur. Abdomen is soft, flat, nontender. Pelvic: Normal external female genitalia.  Moderate amount of blood present in the vaginal vault but no active bleeding through the cervix.  Bimanual exam was suboptimal but no obvious masses or tenderness. Skin is warm and dry without rash. Neurologic: Mental status is normal, cranial nerves are intact, moves all extremities equally.  (all labs ordered are listed, but only abnormal results are displayed) Labs Reviewed  CBC WITH DIFFERENTIAL/PLATELET - Abnormal; Notable for the following components:      Result Value   WBC 10.7 (*)  RBC 3.05 (*)    Hemoglobin 6.1 (*)    HCT 20.7 (*)    MCV 67.9 (*)    MCH 20.0 (*)    MCHC 29.5 (*)    RDW 18.2 (*)    Platelets 529 (*)    All other components within normal limits  BASIC METABOLIC PANEL WITH GFR - Abnormal; Notable for the following components:   Potassium 3.1 (*)    CO2 21 (*)    Glucose, Bld 105 (*)    Calcium 8.7 (*)    All other components within normal limits  URINALYSIS, ROUTINE W REFLEX MICROSCOPIC - Abnormal; Notable for the following components:   APPearance HAZY (*)    Hgb urine dipstick LARGE (*)    Protein, ur 30 (*)    All  other components within normal limits  PREGNANCY, URINE  TYPE AND SCREEN  PREPARE RBC (CROSSMATCH)     Procedures  Cardiac monitor shows sinus tachycardia, per my interpretation. Medications Ordered in the ED  megestrol  (MEGACE ) tablet 40 mg (has no administration in time range)  potassium chloride  SA (KLOR-CON  M) CR tablet 40 mEq (has no administration in time range)  0.9 %  sodium chloride  infusion (Manually program via Guardrails IV Fluids) ( Intravenous New Bag/Given 09/09/23 0527)    Clinical Course as of 09/09/23 0707  Wed Sep 09, 2023  0655 Assumed care. 39 yo F with hx of anemia and heavy menses who presents with anemia. Will go home on progesterone. Needs to fu with GYN.  [RP]    Clinical Course User Index [RP] Yolande Lamar BROCKS, MD                                 Medical Decision Making Amount and/or Complexity of Data Reviewed Labs: ordered.  Risk Prescription drug management.   Abnormal uterine bleeding in spite of using Mirena ring.  I have reviewed her past records, and her baseline hemoglobin seems to be between 10.0 and 11.0.  Most recent hemoglobin on record was 8.5 on 07/13/2023.  She was seen in the emergency department on 07/13/2023 for abnormal uterine bleeding.   I have ordered laboratory workup including CBC and basic metabolic panel and type and screen.  If hemoglobin of 5.8 is confirmed, plan on transfusion.  I have had her laboratory tests, my interpretation is severe anemia with hemoglobin 6.1, mild leukocytosis which is nonspecific, thrombocytosis which is likely reactive, hypokalemia, elevated random glucose level.  Elevated glucose will need to be followed as an outpatient.  I have ordered a dose of oral potassium and I have ordered a dose of oral megestrol  and I have ordered transfusion of 2 units of packed red blood cells.  Plan is to discharge her with prescriptions for megestrol  and potassium and follow-up with her gynecologist.  Case signed out to Dr.  Yolande.  CRITICAL CARE Performed by: Alm Lias Total critical care time: 35 minutes Critical care time was exclusive of separately billable procedures and treating other patients. Critical care was necessary to treat or prevent imminent or life-threatening deterioration. Critical care was time spent personally by me on the following activities: development of treatment plan with patient and/or surrogate as well as nursing, discussions with consultants, evaluation of patient's response to treatment, examination of patient, obtaining history from patient or surrogate, ordering and performing treatments and interventions, ordering and review of laboratory studies, ordering and review of radiographic studies, pulse  oximetry and re-evaluation of patient's condition.     Final diagnoses:  Symptomatic anemia  Abnormal uterine bleeding  Hypokalemia  Elevated random blood glucose level  Thrombocytosis    ED Discharge Orders          Ordered    megestrol  (MEGACE ) 40 MG tablet  3 times daily        09/09/23 0705    potassium chloride  SA (KLOR-CON  M) 20 MEQ tablet  3 times daily        09/09/23 0706               Raford Lenis, MD 09/09/23 [phone]

## 2023-09-09 NOTE — ED Provider Notes (Signed)
  Physical Exam  BP 96/83   Pulse 97   Temp 98.4 F (36.9 C)   Resp 17   Ht 5' 2 (1.575 m)   Wt 69.9 kg   SpO2 100%   BMI 28.19 kg/m   Physical Exam  Procedures  Procedures  ED Course / MDM   Clinical Course as of 09/09/23 1146  Wed Sep 09, 2023  9344 Assumed care from Dr. Raford. 38 yo F with hx of anemia and heavy menses who presents with anemia. Will go home on progesterone. Needs to fu with GYN.  [RP]  1146 Transfusion completed.  Patient reassessed.  She is feeling much better.  No reaction from the transfusion.  Will have her follow-up with her primary doctor and/or OB/GYN for repeat blood work in several days.  Started her on iron  and MiraLAX as well as Megace .  Return precautions discussed prior to discharge. [RP]    Clinical Course User Index [RP] Yolande Lamar BROCKS, MD   Medical Decision Making Amount and/or Complexity of Data Reviewed Labs: ordered.  Risk OTC drugs. Prescription drug management.      Yolande Lamar BROCKS, MD 09/09/23 1146

## 2023-09-09 NOTE — Discharge Instructions (Signed)
 You were seen for your anemia and vaginal bleeding in the emergency department.   At home, please take the iron  supplement daily along with MiraLAX to prevent constipation.  Please take the Megace  until your vaginal bleeding stops.    Check your MyChart online for the results of any tests that had not resulted by the time you left the emergency department.   Follow-up with your primary doctor or OB/GYN in 2-3 days regarding your visit.    Return immediately to the emergency department if you experience any of the following: Bleeding through more than 2 pads an hour for 2 hours, lightheadedness, or any other concerning symptoms.    Thank you for visiting our Emergency Department. It was a pleasure taking care of you today.

## 2023-09-09 NOTE — ED Notes (Signed)
 Dr Raford at bedside for pelvic, this nurse present during pelvic exam

## 2023-09-10 LAB — TYPE AND SCREEN
ABO/RH(D): O POS
Antibody Screen: NEGATIVE
Unit division: 0
Unit division: 0

## 2023-09-10 LAB — BPAM RBC
Blood Product Expiration Date: 202508272359
Blood Product Expiration Date: 202508272359
ISSUE DATE / TIME: 202508060516
ISSUE DATE / TIME: 202508060834
Unit Type and Rh: 5100
Unit Type and Rh: 5100

## 2023-09-11 LAB — IRON,TIBC AND FERRITIN PANEL
Ferritin: 36 ng/mL (ref 15–150)
Iron Saturation: 2 % — CL (ref 15–55)
Iron: 10 ug/dL — ABNORMAL LOW (ref 27–159)
Total Iron Binding Capacity: 441 ug/dL (ref 250–450)
UIBC: 431 ug/dL — ABNORMAL HIGH (ref 131–425)

## 2023-09-11 LAB — CBC
Hematocrit: 21 % — ABNORMAL LOW (ref 34.0–46.6)
Hemoglobin: 5.8 g/dL — CL (ref 11.1–15.9)
MCH: 19.5 pg — ABNORMAL LOW (ref 26.6–33.0)
MCHC: 27.6 g/dL — ABNORMAL LOW (ref 31.5–35.7)
MCV: 71 fL — ABNORMAL LOW (ref 79–97)
Platelets: 503 x10E3/uL — ABNORMAL HIGH (ref 150–450)
RBC: 2.97 x10E6/uL — ABNORMAL LOW (ref 3.77–5.28)
RDW: 16.5 % — ABNORMAL HIGH (ref 11.7–15.4)
WBC: 8 x10E3/uL (ref 3.4–10.8)

## 2023-09-16 ENCOUNTER — Other Ambulatory Visit: Payer: Self-pay | Admitting: *Deleted

## 2023-09-21 ENCOUNTER — Encounter: Payer: Self-pay | Admitting: Family Medicine

## 2023-11-02 ENCOUNTER — Ambulatory Visit (INDEPENDENT_AMBULATORY_CARE_PROVIDER_SITE_OTHER): Admitting: Internal Medicine

## 2023-11-02 ENCOUNTER — Encounter: Payer: Self-pay | Admitting: Family Medicine

## 2023-11-02 ENCOUNTER — Ambulatory Visit (INDEPENDENT_AMBULATORY_CARE_PROVIDER_SITE_OTHER): Admitting: Family Medicine

## 2023-11-02 ENCOUNTER — Encounter: Payer: Self-pay | Admitting: Internal Medicine

## 2023-11-02 VITALS — BP 123/73 | HR 105 | Ht 62.0 in | Wt 160.0 lb

## 2023-11-02 VITALS — BP 106/72 | HR 103 | Temp 98.0°F | Ht 61.0 in | Wt 156.2 lb

## 2023-11-02 DIAGNOSIS — L272 Dermatitis due to ingested food: Secondary | ICD-10-CM | POA: Diagnosis not present

## 2023-11-02 DIAGNOSIS — J3089 Other allergic rhinitis: Secondary | ICD-10-CM | POA: Diagnosis not present

## 2023-11-02 DIAGNOSIS — L501 Idiopathic urticaria: Secondary | ICD-10-CM | POA: Diagnosis not present

## 2023-11-02 DIAGNOSIS — I10 Essential (primary) hypertension: Secondary | ICD-10-CM | POA: Diagnosis not present

## 2023-11-02 DIAGNOSIS — D500 Iron deficiency anemia secondary to blood loss (chronic): Secondary | ICD-10-CM | POA: Diagnosis not present

## 2023-11-02 DIAGNOSIS — J4530 Mild persistent asthma, uncomplicated: Secondary | ICD-10-CM | POA: Diagnosis not present

## 2023-11-02 DIAGNOSIS — J302 Other seasonal allergic rhinitis: Secondary | ICD-10-CM

## 2023-11-02 MED ORDER — FLUTICASONE PROPIONATE 50 MCG/ACT NA SUSP: 2.0000 | Freq: Every day | NASAL | 5 refills | Status: AC

## 2023-11-02 MED ORDER — ALBUTEROL SULFATE HFA 108 (90 BASE) MCG/ACT IN AERS: 1.0000 | INHALATION_SPRAY | Freq: Four times a day (QID) | RESPIRATORY_TRACT | 1 refills | Status: AC | PRN

## 2023-11-02 MED ORDER — FLUTICASONE PROPIONATE HFA 110 MCG/ACT IN AERO: 2.0000 | INHALATION_SPRAY | Freq: Two times a day (BID) | RESPIRATORY_TRACT | 5 refills | Status: AC

## 2023-11-02 MED ORDER — EPINEPHRINE 0.3 MG/0.3ML IJ SOAJ: 0.3000 mg | INTRAMUSCULAR | 1 refills | Status: AC | PRN

## 2023-11-02 MED ORDER — CETIRIZINE HCL 10 MG PO TABS: 10.0000 mg | ORAL_TABLET | Freq: Two times a day (BID) | ORAL | 5 refills | Status: AC | PRN

## 2023-11-02 NOTE — Patient Instructions (Signed)
 Message with concerns.  Follow up with GYN.   Follow up with me in 3-6 months.   Take care  Dr. Bluford

## 2023-11-02 NOTE — Patient Instructions (Addendum)
 Mild persistent asthma - Daily controller medication(s): start Flovent  110mcg 2 puffs twice daily.  Brush and gargle after use.  - Rescue medications: Albuterol  2 puffs every 4-6 hours as needed for wheezing/cough/shortness of breath.  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   - for SKIN only reaction, okay to take Zyrtec  10 mg every 12 hours as needed - for SKIN + ANY additional symptoms, OR IF concern for LIFE THREATENING reaction = Epipen  Autoinjector EpiPen  0.3 mg. - If using Epinephrine  autoinjector, call 911 or go to the ER.   Seasonal and perennial allergic rhinitis (skin testing from [facility]) - Use nasal saline rinses before nose sprays such as with Neilmed Sinus Rinse.  Use distilled water .   - Use Flonase  2 sprays each nostril daily. Aim upward and outward. - Use Zyrtec  10 mg daily.   Chronic urticaria - At this time etiology of hives and swelling is unknown. Hives can be caused by a variety of different triggers including illness/infection, pressure, vibrations, extremes of temperature to name a few however majority of the time there is no identifiable trigger.  -Continue Zyrtec  10mg  daily.  -If no improvement in 2-3 days, increase to Zyrtec  10mg  twice daily.    Hold all anti-histamines (Xyzal, Allegra , Zyrtec , Claritin , Benadryl, Pepcid) 3 days prior to next visit.  Follow up: skin testing 1-55, soy, mushroom, tomato 10/6 at 9 AM

## 2023-11-02 NOTE — Progress Notes (Signed)
 FOLLOW UP Date of Service/Encounter:  11/02/23   Subjective:  Diana [name] (DOB: 1985/03/28) is a 38 y.o. female who [facility] for follow up for asthma, allergic rhinitis, urticaria, food allergies.   History obtained from: chart review and patient. Last seen on 07/31/2021 with Wanda Craze  Asthma- controlled on Air Duo AR- controlled on Xhance  and Claritin . Previously tested by [employer] in 2021 with reactivity to grasses, weeds, molds, dust mites, cats Urticaria- few outbreaks Food allergy- avoids mushrooms, soy  No showed to follow up on 11/2021.  Reports having bronchitis in June and was treated with doxycyline but ever since then has had trouble with getting short of breath easily. Having to use Albuterol  almost daily which does help but then symptoms recur.  Not on a controller inhaler any more. Also was seen in the ED 09/2023 for anemia related to heavy menses, requiring pRBC x1.  Reports obgyn rechecked her Hgb and that was back to normal.  Does have on and off congestion, runny nose, drainage.  Using Flonase  and Zyrtec  PRN.  Not sure if they are helping.    Avoids mushrooms, soy, tomato.  Notes hives and swelling with ingestion.  Needs refill of Epipen .  Chronic hives are doing fine, had some outbreaks in Spring but not anymore.   Past Medical History: Past Medical History:  Diagnosis Date   Asthma    Hypertension    Iron  deficiency anemia    Urticaria     Objective:  BP 106/72   Pulse (!) 103   Temp 98 F (36.7 C)   Ht 5' 1 (1.549 m)   Wt 156 lb 4 oz (70.9 kg)   SpO2 99%   BMI 29.52 kg/m  Body mass index is 29.52 kg/m. Physical Exam: GEN: alert, well developed HEENT: clear conjunctiva, nose with mild inferior turbinate hypertrophy, pale nasal mucosa, + clear rhinorrhea, + cobblestoning HEART: regular rate and rhythm, no murmur LUNGS: clear to auscultation bilaterally, no coughing, unlabored respiration SKIN:  no rashes or lesions  Spirometry:  Tracings reviewed. Her effort: Good reproducible efforts. FVC: 2.3L, 80% predicted  FEV1: 2.07L, 87% predicted FEV1/FVC ratio: 90% Interpretation: Spirometry consistent with normal pattern.  Please see scanned spirometry results for details.  Assessment:   1. Chronic idiopathic urticaria   2. Seasonal and perennial allergic rhinitis   3. Mild persistent asthma without complication   4. Dermatitis due to ingested food     Plan/Recommendations:   Mild persistent asthma - Uncontrolled due to dyspnea since bronchitis episode in June, start ICS.  MDI technique discussed. Spirometry today was normal.  Also had recent ED visit for anemia requiring pRBC but recheck Hgb per patient was normal with obgyn . - Daily controller medication(s): start Flovent  110mcg 2 puffs twice daily.  Brush and gargle after use.  - Rescue medications: Albuterol  2 puffs every 4-6 hours as needed for wheezing/cough/shortness of breath.  - Asthma control goals:  * Full participation in all desired activities (may need albuterol  before activity) * Albuterol  use two time or less a week on average (not counting use with activity) * Cough interfering with sleep two time or less a month * Oral steroids no more than once a year * No hospitalizations  Food Allergy  - Continue to avoid soy, mushroom, tomato.   - Initial rxn: hives and facial swelling - for SKIN only reaction, okay to take Zyrtec  10 mg every 12 hours as needed - for  SKIN + ANY additional symptoms, OR IF concern for LIFE THREATENING reaction = Epipen  Autoinjector EpiPen  0.3 mg. - If using Epinephrine  autoinjector, call 911 or go to the ER.   Seasonal and perennial allergic rhinitis  - Due to turbinate hypertrophy, worsening symptoms, uncontrolled asthma and unresponsive to over the counter meds, will perform skin testing to identify aeroallergen triggers.   - Use nasal saline rinses before nose sprays such as with  Neilmed Sinus Rinse.  Use distilled water .   - Use Flonase  2 sprays each nostril daily. Aim upward and outward. - Use Zyrtec  10 mg daily.   Chronic urticaria - At this time etiology of hives and swelling is unknown. Hives can be caused by a variety of different triggers including illness/infection, pressure, vibrations, extremes of temperature to name a few however majority of the time there is no identifiable trigger.  -Continue Zyrtec  10mg  daily.  -If no improvement in 2-3 days, increase to Zyrtec  10mg  twice daily.    Hold all anti-histamines (Xyzal, Allegra , Zyrtec , Claritin , Benadryl, Pepcid) 3 days prior to next visit.  Follow up: skin testing 1-55, soy, mushroom, tomato 10/6 at 9 AM    Arleta Blanch, MD [facility] 

## 2023-11-03 ENCOUNTER — Ambulatory Visit: Payer: Self-pay | Admitting: Family Medicine

## 2023-11-03 NOTE — Assessment & Plan Note (Addendum)
 Stable

## 2023-11-03 NOTE — Progress Notes (Signed)
 Subjective:  Patient ID: Diana [name], female    DOB: [birth year]  Age: 38 y.o. MRN: [id number]  CC:   Chief Complaint  Patient presents with   Hypertension    Follow up     HPI:  38 year old female presents for follow up.  HTN stable on Norvasc  and Labetalol .   Still having significant vaginal bleeding.  IUD is out.  She is following closely with OB/GYN.  She was recently prescribed Myfembree and is awaiting approval by her insurance.  Per her report, surgical options have been discussed.  She has not yet decided to proceed with surgery although she is leaning towards this as the best option.  She does note that she is likely changing jobs and this will impact her insurance.  Will plan to discuss with OB/GYN today.  Patient Active Problem List   Diagnosis Date Noted   Insomnia 03/05/2022   Anxiety and depression 02/04/2022   Iron  deficiency anemia due to chronic blood loss 09/05/2021   Right thyroid  nodule 01/10/2020   Asthma 11/04/2019   Allergic rhinitis 08/16/2018   Essential hypertension 07/17/2018    Social Hx   Social History   Socioeconomic History   Marital status: Single    Spouse name: Not on file   Number of children: Not on file   Years of education: Not on file   Highest education level: Associate degree: academic program  Occupational History   Not on file  Tobacco Use   Smoking status: Never   Smokeless tobacco: Never  Vaping Use   Vaping status: Never Used  Substance and Sexual Activity   Alcohol use: No   Drug use: No   Sexual activity: Not on file  Other Topics Concern   Not on file  Social History Narrative   Not on file   Social Drivers of Health   Financial Resource Strain: Low Risk  (11/02/2023)   Overall Financial Resource Strain (CARDIA)    Difficulty of Paying Living Expenses: Not hard at all  Food Insecurity: No Food Insecurity (11/02/2023)   Hunger Vital Sign    Worried About Running Out of Food in the Last Year: Never true     Ran Out of Food in the Last Year: Never true  Transportation Needs: No Transportation Needs (11/02/2023)   PRAPARE - Administrator, Civil Service (Medical): No    Lack of Transportation (Non-Medical): No  Physical Activity: Sufficiently Active (11/02/2023)   Exercise Vital Sign    Days of Exercise per Week: 5 days    Minutes of Exercise per Session: 30 min  Stress: No Stress Concern Present (11/02/2023)   [employer] of Occupational Health - Occupational Stress Questionnaire    Feeling of Stress: Not at all  Social Connections: Moderately Integrated (11/02/2023)   Social Connection and Isolation Panel    Frequency of Communication with Friends and Family: Three times a week    Frequency of Social Gatherings with Friends and Family: Once a week    Attends Religious Services: 1 to 4 times per year    Active Member of [employer] or Organizations: Yes    Attends Banker Meetings: 1 to 4 times per year    Marital Status: Never married    Review of Systems Per HPI  Objective:  BP 123/73   Pulse (!) 105   Ht 5' 2 (1.575 m)   Wt 160 lb (72.6 kg)   SpO2 99%   BMI 29.26 kg/m  11/02/2023    1:28 PM 11/02/2023   10:35 AM 09/09/2023   12:00 PM  BP/Weight  Systolic BP 123 106 133  Diastolic BP 73 72 97  Wt. (Lbs) 160 156.25   BMI 29.26 kg/m2 29.52 kg/m2     Physical Exam Vitals and nursing note reviewed.  Constitutional:      General: She is not in acute distress.    Appearance: Normal appearance.  HENT:     Head: Normocephalic and atraumatic.  Cardiovascular:     Rate and Rhythm: Normal rate and regular rhythm.  Pulmonary:     Effort: Pulmonary effort is normal.     Breath sounds: Normal breath sounds. No wheezing, rhonchi or rales.  Neurological:     Mental Status: She is alert.  Psychiatric:     Comments: Flat affect.     Lab Results  Component Value Date   WBC 10.7 (H) 09/09/2023   HGB 6.1 (LL) 09/09/2023   HCT 20.7 (L) 09/09/2023    PLT 529 (H) 09/09/2023   GLUCOSE 105 (H) 09/09/2023   CHOL 185 04/11/2021   TRIG 96 04/11/2021   HDL 55 04/11/2021   LDLCALC 113 (H) 04/11/2021   ALT 24 07/13/2023   AST 15 07/13/2023   NA 139 09/09/2023   K 3.1 (L) 09/09/2023   CL 106 09/09/2023   CREATININE 0.50 09/09/2023   BUN 11 09/09/2023   CO2 21 (L) 09/09/2023   TSH 0.815 04/11/2021   HGBA1C 5.3 06/22/2020     Assessment & Plan:  Iron  deficiency anemia due to chronic blood loss Assessment & Plan: Secondary to vaginal bleeding.  I spoke with her OB/GYN today, Dr. Dannielle.  She believes that her best option is a myomectomy.  I relayed this information to the patient.  She states that she plans on proceeding with this option after job/insurance change.  I updated her OB/GYN about this.  She will need close follow-up.   Essential hypertension Assessment & Plan: Stable.     Follow-up:  3-6 months  Phill Steck Bluford DO [facility]

## 2023-11-03 NOTE — Assessment & Plan Note (Signed)
 Secondary to vaginal bleeding.  I spoke with her OB/GYN today, Dr. Dannielle.  She believes that her best option is a myomectomy.  I relayed this information to the patient.  She states that she plans on proceeding with this option after job/insurance change.  I updated her OB/GYN about this.  She will need close follow-up.

## 2023-11-09 ENCOUNTER — Ambulatory Visit: Admitting: Internal Medicine

## 2023-11-12 ENCOUNTER — Encounter: Payer: Self-pay | Admitting: Family Medicine

## 2023-11-17 ENCOUNTER — Other Ambulatory Visit: Payer: Self-pay | Admitting: Family Medicine

## 2023-11-17 DIAGNOSIS — D500 Iron deficiency anemia secondary to blood loss (chronic): Secondary | ICD-10-CM

## 2023-11-17 NOTE — Telephone Encounter (Signed)
 Would she need a plain hemoglobin via the lab or cbc with iron  panel, please advise thanks

## 2023-11-20 LAB — CBC
Hematocrit: 26.7 % — ABNORMAL LOW (ref 34.0–46.6)
Hemoglobin: 7.7 g/dL — ABNORMAL LOW (ref 11.1–15.9)
MCH: 22.5 pg — ABNORMAL LOW (ref 26.6–33.0)
MCHC: 28.8 g/dL — ABNORMAL LOW (ref 31.5–35.7)
MCV: 78 fL — ABNORMAL LOW (ref 79–97)
Platelets: 679 x10E3/uL — ABNORMAL HIGH (ref 150–450)
RBC: 3.42 x10E6/uL — ABNORMAL LOW (ref 3.77–5.28)
RDW: 18.4 % — ABNORMAL HIGH (ref 11.7–15.4)
WBC: 11.3 x10E3/uL — ABNORMAL HIGH (ref 3.4–10.8)

## 2023-11-20 LAB — IRON,TIBC AND FERRITIN PANEL
Ferritin: 23 ng/mL (ref 15–150)
Iron Saturation: 11 % — ABNORMAL LOW (ref 15–55)
Iron: 48 ug/dL (ref 27–159)
Total Iron Binding Capacity: 456 ug/dL — ABNORMAL HIGH (ref 250–450)
UIBC: 408 ug/dL (ref 131–425)

## 2023-11-22 ENCOUNTER — Ambulatory Visit: Payer: Self-pay | Admitting: Family Medicine

## 2023-12-02 ENCOUNTER — Encounter: Payer: Self-pay | Admitting: Family Medicine

## 2023-12-04 ENCOUNTER — Other Ambulatory Visit: Payer: Self-pay

## 2023-12-04 MED ORDER — LABETALOL HCL 100 MG PO TABS: 100.0000 mg | ORAL_TABLET | Freq: Two times a day (BID) | ORAL | 1 refills | Status: AC

## 2023-12-07 ENCOUNTER — Other Ambulatory Visit: Payer: Self-pay | Admitting: Family Medicine

## 2024-01-30 ENCOUNTER — Telehealth: Admitting: Nurse Practitioner

## 2024-01-30 DIAGNOSIS — R197 Diarrhea, unspecified: Secondary | ICD-10-CM | POA: Diagnosis not present

## 2024-01-30 NOTE — Patient Instructions (Signed)
 " Emmie Ghent, thank you for joining Haze LELON Servant, NP for today's virtual visit.  While this provider is not your primary care provider (PCP), if your PCP is located in our provider database this encounter information will be shared with them immediately following your visit.   A [facility] MyChart account gives you access to today's visit and all your visits, tests, and labs performed at [facility]  click here if you don't have a [facility] MyChart account or go to [email or web address]  Consent: (Patient) Diana [name] provided verbal consent for this virtual visit at the beginning of the encounter.  Current Medications:  Current Outpatient Medications:    albuterol  (VENTOLIN  HFA) 108 (90 Base) MCG/ACT inhaler, Inhale 1-2 puffs into the lungs every 6 (six) hours as needed for wheezing or shortness of breath., Disp: 8.5 g, Rfl: 1   amLODipine  (NORVASC ) 10 MG tablet, Take 1 tablet (10 mg total) by mouth daily., Disp: 90 tablet, Rfl: 3   cetirizine  (ZYRTEC  ALLERGY) 10 MG tablet, Take 1 tablet (10 mg total) by mouth 2 (two) times daily as needed for allergies (or hives)., Disp: 60 tablet, Rfl: 5   Cholecalciferol (VITAMIN D3) 50 MCG (2000 UT) capsule, TAKE 1 CAPSULE BY MOUTH EVERY DAY, Disp: 90 capsule, Rfl: 1   EPINEPHrine  (EPIPEN  2-PAK) 0.3 mg/0.3 mL IJ SOAJ injection, Inject 0.3 mg into the muscle as needed for anaphylaxis., Disp: 2 each, Rfl: 1   ferrous sulfate  325 (65 FE) MG tablet, Take 1 tablet (325 mg total) by mouth daily., Disp: 30 tablet, Rfl: 0   fluticasone  (FLONASE ) 50 MCG/ACT nasal spray, Place 2 sprays into both nostrils daily., Disp: 16 g, Rfl: 5   fluticasone  (FLOVENT  HFA) 110 MCG/ACT inhaler, Inhale 2 puffs into the lungs in the morning and at bedtime., Disp: 1 each, Rfl: 5   ibuprofen  (ADVIL ) 800 MG tablet, Take 1 tablet 3 times a day by oral route with meal(s)., Disp: , Rfl:    labetalol  (NORMODYNE ) 100 MG tablet, Take 1 tablet (100 mg total) by mouth 2  (two) times daily., Disp: 180 tablet, Rfl: 1   megestrol  (MEGACE ) 40 MG tablet, Take 1 tablet (40 mg total) by mouth 3 (three) times daily., Disp: 90 tablet, Rfl: 0   potassium chloride  SA (KLOR-CON  M) 20 MEQ tablet, Take 1 tablet (20 mEq total) by mouth 3 (three) times daily., Disp: 15 tablet, Rfl: 0   Prenatal Vit-Fe Fumarate-FA (PRENATAL VITAMINS) 28-0.8 MG TABS, 1 tablet daily, Disp: 30 tablet, Rfl:    Relugolix-Estradiol-Norethind (MYFEMBREE) 40-1-0.5 MG TABS, Take 1 tablet every day by oral route. (Patient not taking: Reported on 11/02/2023), Disp: , Rfl:    Medications ordered in this encounter:  No orders of the defined types were placed in this encounter.    *If you need refills on other medications prior to your next appointment, please contact your pharmacy*  Follow-Up: Call back or seek an in-person evaluation if the symptoms worsen or if the condition fails to improve as anticipated.  [facility] [phone]  Other Instructions Continue Imodium as prescribed. If onset of fever, watery or bloody diarrhea will need to be seen in person   If you have been instructed to have an in-person evaluation today at a local Urgent Care facility, please use the link below. It will take you to a list of all of our available [facility], including address, phone number and hours of operation. Please do not delay care.  [facility]  If you or a family member do not have a primary care provider, use the link below to schedule a visit and establish care. When you choose a [facility] primary care physician or advanced practice provider, you gain a long-term partner in health. Find a Primary Care Provider  Learn more about [facility]'s in-office and virtual care options: [facility] Get Care Now  "

## 2024-01-30 NOTE — Progress Notes (Signed)
 " Virtual Visit Consent   Diana [name], you are scheduled for a virtual visit with a [facility] provider today. Just as with appointments in the office, your consent must be obtained to participate. Your consent will be active for this visit and any virtual visit you may have with one of our providers in the next 365 days. If you have a MyChart account, a copy of this consent can be sent to you electronically.  As this is a virtual visit, video technology does not allow for your provider to perform a traditional examination. This may limit your provider's ability to fully assess your condition. If your provider identifies any concerns that need to be evaluated in person or the need to arrange testing (such as labs, EKG, etc.), we will make arrangements to do so. Although advances in technology are sophisticated, we cannot ensure that it will always work on either your end or our end. If the connection with a video visit is poor, the visit may have to be switched to a telephone visit. With either a video or telephone visit, we are not always able to ensure that we have a secure connection.  By engaging in this virtual visit, you consent to the provision of healthcare and authorize for your insurance to be billed (if applicable) for the services provided during this visit. Depending on your insurance coverage, you may receive a charge related to this service.  I need to obtain your verbal consent now. Are you willing to proceed with your visit today? Diana [name] has provided verbal consent on 01/30/2024 for a virtual visit (video or telephone). Diana LELON Servant, NP  Date: 01/30/2024 9:35 AM   Virtual Visit via Video Note   I, Diana [name], connected with  Diana [name]  ([id number], 04/02/1985) on 01/30/2024 at  9:30 AM EST by a video-enabled telemedicine application and verified that I am speaking with the correct person using two identifiers.  Location: Patient: Virtual Visit Location Patient:  Home Provider: Virtual Visit Location Provider: Home Office   I discussed the limitations of evaluation and management by telemedicine and the availability of in person appointments. The patient expressed understanding and agreed to proceed.    History of Present Illness: Diana [name] is a 38 y.o. who identifies as a female who was assigned female at birth, and is being seen today for loose stools.  Over the past 3 days Diana [name] has been experiencing intermittent episodes of loose stools.  She has not been started on any new medications recently and denies any recent changes in her diet.  She has taken Imodium 1-2 times with no improvement.  There is no fever, nausea or vomiting present   Problems:  Patient Active Problem List   Diagnosis Date Noted   Insomnia 03/05/2022   Anxiety and depression 02/04/2022   Iron  deficiency anemia due to chronic blood loss 09/05/2021   Right thyroid  nodule 01/10/2020   Asthma 11/04/2019   Allergic rhinitis 08/16/2018   Essential hypertension 07/17/2018    Allergies: Allergies[1] Medications: Current Medications[2]  Observations/Objective: Patient is well-developed, well-nourished in no acute distress.  Resting comfortably at home.  Head is normocephalic, atraumatic.  No labored breathing.  Speech is clear and coherent with logical content.  Patient is alert and oriented at baseline.    Assessment and Plan: 1. Diarrhea, unspecified type (Primary) Continue Imodium as prescribed. If onset of fever, watery or bloody diarrhea will need to be seen in person   Follow Up Instructions:  I discussed the assessment and treatment plan with the patient. The patient was provided an opportunity to ask questions and all were answered. The patient agreed with the plan and demonstrated an understanding of the instructions.  A copy of instructions were sent to the patient via MyChart unless otherwise noted below.     The patient was advised to call back or  seek an in-person evaluation if the symptoms worsen or if the condition fails to improve as anticipated.    Diana LELON Servant, NP      [1]  Allergies Allergen Reactions   Amoxicillin Itching, Dermatitis and Rash   Dog Epithelium (Canis Lupus Familiaris) Itching   Gramineae Pollens Rash   Pseudoephedrine Palpitations    Raises BP also  Sudafed  [2]  Current Outpatient Medications:    albuterol  (VENTOLIN  HFA) 108 (90 Base) MCG/ACT inhaler, Inhale 1-2 puffs into the lungs every 6 (six) hours as needed for wheezing or shortness of breath., Disp: 8.5 g, Rfl: 1   amLODipine  (NORVASC ) 10 MG tablet, Take 1 tablet (10 mg total) by mouth daily., Disp: 90 tablet, Rfl: 3   cetirizine  (ZYRTEC  ALLERGY) 10 MG tablet, Take 1 tablet (10 mg total) by mouth 2 (two) times daily as needed for allergies (or hives)., Disp: 60 tablet, Rfl: 5   Cholecalciferol (VITAMIN D3) 50 MCG (2000 UT) capsule, TAKE 1 CAPSULE BY MOUTH EVERY DAY, Disp: 90 capsule, Rfl: 1   EPINEPHrine  (EPIPEN  2-PAK) 0.3 mg/0.3 mL IJ SOAJ injection, Inject 0.3 mg into the muscle as needed for anaphylaxis., Disp: 2 each, Rfl: 1   ferrous sulfate  325 (65 FE) MG tablet, Take 1 tablet (325 mg total) by mouth daily., Disp: 30 tablet, Rfl: 0   fluticasone  (FLONASE ) 50 MCG/ACT nasal spray, Place 2 sprays into both nostrils daily., Disp: 16 g, Rfl: 5   fluticasone  (FLOVENT  HFA) 110 MCG/ACT inhaler, Inhale 2 puffs into the lungs in the morning and at bedtime., Disp: 1 each, Rfl: 5   ibuprofen  (ADVIL ) 800 MG tablet, Take 1 tablet 3 times a day by oral route with meal(s)., Disp: , Rfl:    labetalol  (NORMODYNE ) 100 MG tablet, Take 1 tablet (100 mg total) by mouth 2 (two) times daily., Disp: 180 tablet, Rfl: 1   megestrol  (MEGACE ) 40 MG tablet, Take 1 tablet (40 mg total) by mouth 3 (three) times daily., Disp: 90 tablet, Rfl: 0   potassium chloride  SA (KLOR-CON  M) 20 MEQ tablet, Take 1 tablet (20 mEq total) by mouth 3 (three) times daily., Disp: 15 tablet,  Rfl: 0   Prenatal Vit-Fe Fumarate-FA (PRENATAL VITAMINS) 28-0.8 MG TABS, 1 tablet daily, Disp: 30 tablet, Rfl:    Relugolix-Estradiol-Norethind (MYFEMBREE) 40-1-0.5 MG TABS, Take 1 tablet every day by oral route. (Patient not taking: Reported on 11/02/2023), Disp: , Rfl:   "

## 2024-02-05 ENCOUNTER — Ambulatory Visit
Admission: RE | Admit: 2024-02-05 | Discharge: 2024-02-05 | Disposition: A | Discharge status: Home / Self Care | Source: Ambulatory Visit | Attending: Nurse Practitioner

## 2024-02-05 ENCOUNTER — Other Ambulatory Visit: Payer: Self-pay

## 2024-02-05 VITALS — BP 152/106 | HR 92 | Temp 98.6°F | Resp 20

## 2024-02-05 DIAGNOSIS — R197 Diarrhea, unspecified: Secondary | ICD-10-CM

## 2024-02-05 LAB — POCT URINE DIPSTICK
Bilirubin, UA: NEGATIVE
Blood, UA: NEGATIVE
Glucose, UA: NEGATIVE mg/dL
Ketones, POC UA: NEGATIVE mg/dL
Leukocytes, UA: NEGATIVE
Nitrite, UA: NEGATIVE
POC PROTEIN,UA: 30 — AB
Spec Grav, UA: 1.02
Urobilinogen, UA: 0.2 U/dL
pH, UA: 6

## 2024-02-05 MED ORDER — DIPHENOXYLATE-ATROPINE 2.5-0.025 MG PO TABS: 1.0000 | ORAL_TABLET | Freq: Four times a day (QID) | ORAL | 0 refills | Status: AC | PRN

## 2024-02-05 NOTE — ED Notes (Signed)
 Pt attempting urine sample at this time

## 2024-02-05 NOTE — Discharge Instructions (Addendum)
 CBC and BMP are pending.  You will be contacted if the pending test results are abnormal.  You will also have access to your results via MyChart. Take medication as prescribed. Increase fluids.  Try to drink at least 8-10 8 ounce glasses of water  daily or drink Pedialyte to help prevent dehydration. Recommend food choices to help increase the bulk in your stool.  This includes foods that are high in fiber such as wheat toast, rice, or applesauce.  I have also provided information in which she can refer to. Continue to monitor your symptoms for worsening.  If symptoms have not improved over the next 48 to 72 hours, recommend follow-up in the emergency department or with your primary care physician for further evaluation.  Go to the emergency department if you develop worsening diarrhea, abdominal pain, or new symptoms of fever, chills, or bloody stools. Follow-up as needed.

## 2024-02-05 NOTE — ED Provider Notes (Signed)
 " [facility]    CSN: [id number] Arrival date & time: 02/05/24  1113      History   Chief Complaint Chief Complaint  Patient presents with   Diarrhea    Entered by patient    HPI Diana [name] is a 39 y.o. female.   The history is provided by the patient.   Patient presents for complaints of diarrhea that is been present for the past week.  She states prior to her symptoms starting, she ate oysters at [employer].  She reports at least 3 episodes of diarrhea daily, she also endorses some mild right lower quadrant abdominal pain and bloating.  Denies fever, chills, chest pain, nausea, vomiting, bloody stools, melena stools, or urinary symptoms.  States that she did try to make dietary changes by adding soups and crackers to her diet, also states that she did start Imodium with minimal relief of her symptoms.  Denies prior history of gastrointestinal disease.  States that she has recently developed food allergies, particularly tomatoes.  Past Medical History:  Diagnosis Date   Asthma    Hypertension    Iron  deficiency anemia    Urticaria     Patient Active Problem List   Diagnosis Date Noted   Insomnia 03/05/2022   Anxiety and depression 02/04/2022   Iron  deficiency anemia due to chronic blood loss 09/05/2021   Right thyroid  nodule 01/10/2020   Asthma 11/04/2019   Allergic rhinitis 08/16/2018   Essential hypertension 07/17/2018    History reviewed. No pertinent surgical history.  OB History   No obstetric history on file.      Home Medications    Prior to Admission medications  Medication Sig Start Date End Date Taking? Authorizing Provider  albuterol  (VENTOLIN  HFA) 108 (90 Base) MCG/ACT inhaler Inhale 1-2 puffs into the lungs every 6 (six) hours as needed for wheezing or shortness of breath. 11/02/23   Tobie Arleta SQUIBB, MD  amLODipine  (NORVASC ) 10 MG tablet Take 1 tablet (10 mg total) by mouth daily. 08/03/23   Cook, Jayce G, DO  cetirizine  (ZYRTEC  ALLERGY)  10 MG tablet Take 1 tablet (10 mg total) by mouth 2 (two) times daily as needed for allergies (or hives). 11/02/23   Tobie Arleta SQUIBB, MD  Cholecalciferol (VITAMIN D3) 50 MCG (2000 UT) capsule TAKE 1 CAPSULE BY MOUTH EVERY DAY 06/12/21   Ameduite, Leonna S, FNP  EPINEPHrine  (EPIPEN  2-PAK) 0.3 mg/0.3 mL IJ SOAJ injection Inject 0.3 mg into the muscle as needed for anaphylaxis. 11/02/23   Tobie Arleta SQUIBB, MD  ferrous sulfate  325 (65 FE) MG tablet Take 1 tablet (325 mg total) by mouth daily. 09/09/23   Yolande Lamar BROCKS, MD  fluticasone  (FLONASE ) 50 MCG/ACT nasal spray Place 2 sprays into both nostrils daily. 11/02/23   Tobie Arleta SQUIBB, MD  fluticasone  (FLOVENT  HFA) 110 MCG/ACT inhaler Inhale 2 puffs into the lungs in the morning and at bedtime. 11/02/23   Tobie Arleta SQUIBB, MD  ibuprofen  (ADVIL ) 800 MG tablet Take 1 tablet 3 times a day by oral route with meal(s). 09/18/20   [provider]  labetalol  (NORMODYNE ) 100 MG tablet Take 1 tablet (100 mg total) by mouth 2 (two) times daily. 12/04/23   Cook, Jayce G, DO  megestrol  (MEGACE ) 40 MG tablet Take 1 tablet (40 mg total) by mouth 3 (three) times daily. 09/09/23   Raford Lenis, MD  potassium chloride  SA (KLOR-CON  M) 20 MEQ tablet Take 1 tablet (20 mEq total) by mouth 3 (three) times  daily. 09/09/23   Raford Lenis, MD  Prenatal Vit-Fe Fumarate-FA (PRENATAL VITAMINS) 28-0.8 MG TABS 1 tablet daily 07/16/18   Morganfield, Kawanta F, MD  Relugolix-Estradiol-Norethind [facility]) 40-1-0.5 MG TABS Take 1 tablet every day by oral route. Patient not taking: Reported on 11/02/2023 10/23/23   [provider]    Family History Family History  Problem Relation Age of Onset   Hypertension Mother    Hypertension Father    Asthma Sister    Allergic rhinitis Paternal Grandmother     Social History Social History[1]   Allergies   Amoxicillin, Dog epithelium (canis lupus familiaris), Gramineae pollens, and Pseudoephedrine   Review of Systems Review of  Systems Per HPI  Physical Exam Triage Vital Signs ED Triage Vitals [02/05/24 1133]  Encounter Vitals Group     BP (!) 162/99     Girls Systolic BP Percentile      Girls Diastolic BP Percentile      Boys Systolic BP Percentile      Boys Diastolic BP Percentile      Pulse Rate (!) 110     Resp 20     Temp 98.6 F (37 C)     Temp Source Oral     SpO2 98 %     Weight      Height      Head Circumference      Peak Flow      Pain Score      Pain Loc      Pain Education      Exclude from Growth Chart    No data found.  Updated Vital Signs BP (!) 162/99 (BP Location: Right Arm)   Pulse (!) 110   Temp 98.6 F (37 C) (Oral)   Resp 20   LMP 01/16/2024 (Approximate)   SpO2 98%   Visual Acuity Right Eye Distance:   Left Eye Distance:   Bilateral Distance:    Right Eye Near:   Left Eye Near:    Bilateral Near:     Physical Exam Vitals and nursing note reviewed.  Constitutional:      General: She is not in acute distress.    Appearance: Normal appearance.  HENT:     Head: Normocephalic.  Eyes:     Extraocular Movements: Extraocular movements intact.     Pupils: Pupils are equal, round, and reactive to light.  Cardiovascular:     Rate and Rhythm: Normal rate and regular rhythm.     Pulses: Normal pulses.     Heart sounds: Normal heart sounds.  Pulmonary:     Effort: Pulmonary effort is normal. No respiratory distress.     Breath sounds: Normal breath sounds. No stridor. No wheezing, rhonchi or rales.  Abdominal:     General: Bowel sounds are normal.     Palpations: Abdomen is soft.     Tenderness: There is abdominal tenderness.  Musculoskeletal:     Cervical back: Normal range of motion.  Skin:    General: Skin is warm and dry.  Neurological:     General: No focal deficit present.     Mental Status: She is alert and oriented to person, place, and time.  Psychiatric:        Mood and Affect: Mood normal.        Behavior: Behavior normal.      UC  Treatments / Results  Labs (all labs ordered are listed, but only abnormal results are displayed) Labs Reviewed  POCT URINE DIPSTICK -  Abnormal; Notable for the following components:      Result Value   Clarity, UA cloudy (*)    POC PROTEIN,UA =30 (*)    All other components within normal limits  BASIC METABOLIC PANEL WITH GFR  CBC WITH DIFFERENTIAL/PLATELET    EKG   Radiology No results found.  Procedures Procedures (including critical care time)  Medications Ordered in UC Medications - No data to display  Initial Impression / Assessment and Plan / UC Course  I have reviewed the triage vital signs and the nursing notes.  Pertinent labs & imaging results that were available during my care of the patient were reviewed by me and considered in my medical decision making (see chart for details).  Urinalysis was negative, she does have some proteinuria, will have patient follow-up with her primary care physician for recheck.  This most likely could be caused by the recent diarrhea she has been experiencing.  CBC and BMP are pending for safety.  Patient's blood pressure was elevated during triage along with her heart rate, recheck of BP was 152/106, heart rate 92.  Patient denies chest pain, shortness of breath, difficulty breathing, recommend follow-up with her PCP regarding her blood pressure and regarding the protein in her urine.  Will treat with Lomotil for diarrhea as patient reports that she has tried Imodium without success.  Supportive care recommendations were provided and discussed with the patient to include over-the-counter analgesics, use of Gatorade or Pedialyte, food choices to help this decrease diarrhea, and to monitor for worsening.  Patient was advised if symptoms fail to improve, recommend follow-up with her primary care physician or in the emergency department for further evaluation.  Patient was also given strict ER follow-up precautions.  Patient was in agreement with  this plan of care and verbalizes understanding.  All questions were answered.  Patient stable for discharge.   Final Clinical Impressions(s) / UC Diagnoses   Final diagnoses:  Diarrhea, unspecified type     Discharge Instructions      CBC and BMP are pending.  You will be contacted if the pending test results are abnormal.  You will also have access to your results via MyChart. Take medication as prescribed. Increase fluids.  Try to drink at least 8-10 8 ounce glasses of water  daily or drink Pedialyte to help prevent dehydration. Recommend food choices to help increase the bulk in your stool.  This includes foods that are high in fiber such as wheat toast, rice, or applesauce.  I have also provided information in which she can refer to. Continue to monitor your symptoms for worsening.  If symptoms have not improved over the next 48 to 72 hours, recommend follow-up in the emergency department or with your primary care physician for further evaluation. Follow-up as needed.     ED Prescriptions   None    PDMP not reviewed this encounter.    [1]  Social History Tobacco Use   Smoking status: Never   Smokeless tobacco: Never  Vaping Use   Vaping status: Never Used  Substance Use Topics   Alcohol use: No   Drug use: No     Gilmer Etta PARAS, NP 02/05/24 1306  "

## 2024-02-05 NOTE — ED Triage Notes (Addendum)
 Pt reports diarrhea x1 week. Pt reports thought it was food poisoning after eating at [employer]. Pt reports has changed diet but symptoms remain, denies any known fevers or recent abx use.

## 2024-02-05 NOTE — ED Notes (Signed)
 Pt attempted to provide urine sample. Unable to provide sample. Provider at bedside and aware.

## 2024-02-08 ENCOUNTER — Ambulatory Visit (HOSPITAL_COMMUNITY): Payer: Self-pay

## 2024-02-08 LAB — CBC WITH DIFFERENTIAL/PLATELET
Basophils Absolute: 0 x10E3/uL (ref 0.0–0.2)
Basos: 0 %
EOS (ABSOLUTE): 0.1 x10E3/uL (ref 0.0–0.4)
Eos: 2 %
Hematocrit: 32.8 % — ABNORMAL LOW (ref 34.0–46.6)
Hemoglobin: 9.6 g/dL — ABNORMAL LOW (ref 11.1–15.9)
Immature Grans (Abs): 0 x10E3/uL (ref 0.0–0.1)
Immature Granulocytes: 0 %
Lymphocytes Absolute: 2.4 x10E3/uL (ref 0.7–3.1)
Lymphs: 33 %
MCH: 21.2 pg — ABNORMAL LOW (ref 26.6–33.0)
MCHC: 29.3 g/dL — ABNORMAL LOW (ref 31.5–35.7)
MCV: 73 fL — ABNORMAL LOW (ref 79–97)
Monocytes Absolute: 0.8 x10E3/uL (ref 0.1–0.9)
Monocytes: 11 %
Neutrophils Absolute: 3.9 x10E3/uL (ref 1.4–7.0)
Neutrophils: 54 %
Platelets: 712 x10E3/uL — ABNORMAL HIGH (ref 150–450)
RBC: 4.52 x10E6/uL (ref 3.77–5.28)
RDW: 15.3 % (ref 11.7–15.4)
WBC: 7.2 x10E3/uL (ref 3.4–10.8)

## 2024-02-08 LAB — BASIC METABOLIC PANEL WITH GFR
BUN/Creatinine Ratio: 12 (ref 9–23)
BUN: 10 mg/dL (ref 6–20)
CO2: 23 mmol/L (ref 20–29)
Calcium: 9.5 mg/dL (ref 8.7–10.2)
Chloride: 104 mmol/L (ref 96–106)
Creatinine, Ser: 0.86 mg/dL (ref 0.57–1.00)
Glucose: 90 mg/dL (ref 70–99)
Potassium: 3.8 mmol/L (ref 3.5–5.2)
Sodium: 140 mmol/L (ref 134–144)
eGFR: 88 mL/min/1.73
